# Patient Record
Sex: Female | Born: 1945 | Race: Black or African American | Hispanic: No | Smoking: Former smoker
Health system: Southern US, Community
[De-identification: ages and names within clinical notes are randomized; demographics above are authoritative.]

## PROBLEM LIST (undated history)

## (undated) DIAGNOSIS — I10 Essential (primary) hypertension: Secondary | ICD-10-CM

## (undated) DIAGNOSIS — E785 Hyperlipidemia, unspecified: Secondary | ICD-10-CM

## (undated) DIAGNOSIS — E119 Type 2 diabetes mellitus without complications: Secondary | ICD-10-CM

## (undated) HISTORY — DX: Type 2 diabetes mellitus without complications: E11.9

## (undated) HISTORY — DX: Essential (primary) hypertension: I10

## (undated) HISTORY — DX: Hyperlipidemia, unspecified: E78.5

## (undated) HISTORY — PX: OTHER SURGICAL HISTORY: SHX169

---

## 1978-01-05 HISTORY — PX: CHOLECYSTECTOMY: SHX55

## 2009-08-28 ENCOUNTER — Other Ambulatory Visit: Admission: RE | Admit: 2009-08-28 | Discharge: 2009-08-28 | Payer: Self-pay | Admitting: Obstetrics & Gynecology

## 2009-08-28 ENCOUNTER — Ambulatory Visit: Payer: Self-pay | Admitting: Obstetrics & Gynecology

## 2009-09-18 ENCOUNTER — Ambulatory Visit: Payer: Self-pay | Admitting: Obstetrics & Gynecology

## 2009-10-10 ENCOUNTER — Ambulatory Visit: Payer: Self-pay | Admitting: Obstetrics and Gynecology

## 2009-10-10 ENCOUNTER — Other Ambulatory Visit: Admission: RE | Admit: 2009-10-10 | Discharge: 2009-10-10 | Payer: Self-pay | Admitting: Obstetrics & Gynecology

## 2009-10-31 ENCOUNTER — Ambulatory Visit: Payer: Self-pay | Admitting: Obstetrics and Gynecology

## 2012-01-06 LAB — HM COLONOSCOPY

## 2013-01-05 LAB — HM PAP SMEAR: HM Pap smear: NORMAL

## 2013-01-05 LAB — HM MAMMOGRAPHY: HM Mammogram: NORMAL

## 2014-07-18 ENCOUNTER — Telehealth: Payer: Self-pay | Admitting: *Deleted

## 2014-07-18 ENCOUNTER — Encounter: Payer: Self-pay | Admitting: *Deleted

## 2014-07-18 NOTE — Telephone Encounter (Signed)
Pre-Visit Call completed with patient and chart updated.   Pre-Visit Info documented in Specialty Comments under SnapShot.    

## 2014-07-19 ENCOUNTER — Ambulatory Visit (INDEPENDENT_AMBULATORY_CARE_PROVIDER_SITE_OTHER): Payer: Commercial Managed Care - HMO | Admitting: Family

## 2014-07-19 ENCOUNTER — Encounter: Payer: Self-pay | Admitting: Family

## 2014-07-19 VITALS — BP 120/80 | HR 60 | Temp 98.1°F | Resp 16 | Ht 69.0 in | Wt 197.4 lb

## 2014-07-19 DIAGNOSIS — R5382 Chronic fatigue, unspecified: Secondary | ICD-10-CM | POA: Diagnosis not present

## 2014-07-19 DIAGNOSIS — J0100 Acute maxillary sinusitis, unspecified: Secondary | ICD-10-CM

## 2014-07-19 LAB — HEPATIC FUNCTION PANEL
ALK PHOS: 44 U/L (ref 39–117)
ALT: 16 U/L (ref 0–35)
AST: 20 U/L (ref 0–37)
Albumin: 3.8 g/dL (ref 3.5–5.2)
Bilirubin, Direct: 0.1 mg/dL (ref 0.0–0.3)
TOTAL PROTEIN: 7.3 g/dL (ref 6.0–8.3)
Total Bilirubin: 0.5 mg/dL (ref 0.2–1.2)

## 2014-07-19 LAB — URINALYSIS, ROUTINE W REFLEX MICROSCOPIC
Bilirubin Urine: NEGATIVE
Ketones, ur: NEGATIVE
Leukocytes, UA: NEGATIVE
Nitrite: NEGATIVE
Specific Gravity, Urine: 1.025 (ref 1.000–1.030)
Total Protein, Urine: NEGATIVE
Urine Glucose: NEGATIVE
Urobilinogen, UA: 0.2 (ref 0.0–1.0)
pH: 5.5 (ref 5.0–8.0)

## 2014-07-19 LAB — CBC WITH DIFFERENTIAL/PLATELET
Basophils Absolute: 0 10*3/uL (ref 0.0–0.1)
Basophils Relative: 0.6 % (ref 0.0–3.0)
Eosinophils Absolute: 0.1 10*3/uL (ref 0.0–0.7)
Eosinophils Relative: 1.7 % (ref 0.0–5.0)
HCT: 42.9 % (ref 36.0–46.0)
Hemoglobin: 14.2 g/dL (ref 12.0–15.0)
LYMPHS PCT: 36.2 % (ref 12.0–46.0)
Lymphs Abs: 2.9 10*3/uL (ref 0.7–4.0)
MCHC: 33.1 g/dL (ref 30.0–36.0)
MCV: 95.8 fl (ref 78.0–100.0)
Monocytes Absolute: 0.4 10*3/uL (ref 0.1–1.0)
Monocytes Relative: 5.3 % (ref 3.0–12.0)
NEUTROS ABS: 4.5 10*3/uL (ref 1.4–7.7)
NEUTROS PCT: 56.2 % (ref 43.0–77.0)
Platelets: 180 10*3/uL (ref 150.0–400.0)
RBC: 4.48 Mil/uL (ref 3.87–5.11)
RDW: 13.7 % (ref 11.5–15.5)
WBC: 8.2 10*3/uL (ref 4.0–10.5)

## 2014-07-19 LAB — BASIC METABOLIC PANEL
BUN: 14 mg/dL (ref 6–23)
CALCIUM: 9.2 mg/dL (ref 8.4–10.5)
CO2: 27 meq/L (ref 19–32)
Chloride: 105 mEq/L (ref 96–112)
Creatinine, Ser: 0.81 mg/dL (ref 0.40–1.20)
GFR: 90.52 mL/min (ref >60.00)
GLUCOSE: 145 mg/dL — AB (ref 70–99)
Potassium: 3.7 mEq/L (ref 3.5–5.1)
Sodium: 139 mEq/L (ref 135–145)

## 2014-07-19 LAB — TSH: TSH: 2.06 u[IU]/mL (ref 0.35–4.50)

## 2014-07-19 MED ORDER — AMOXICILLIN-POT CLAVULANATE 875-125 MG PO TABS: 1.0000 | ORAL_TABLET | Freq: Two times a day (BID) | ORAL | Status: DC

## 2014-07-19 NOTE — Progress Notes (Signed)
Pre visit review using our clinic review tool, if applicable. No additional management support is needed unless otherwise documented below in the visit note. 

## 2014-07-19 NOTE — Patient Instructions (Addendum)
Please complete lab work prior to leaving. Start augmentin for sinus infection. Follow up in 2 weeks.  Call sooner if symptoms worsen or if symptoms do not improve.

## 2014-07-19 NOTE — Progress Notes (Signed)
Subjective:    Patient ID: [name], female    DOB: 10/05/1945, 69 y.o.   MRN: [id number]  HPI  Ms. Nicole Waters is a 69 yr old female who presents today to establish care.  She is a poor historian.  She reports chief complaint of fatigue. Pt reports that she began feeling run down about 1 month ago.  She reports associated sputum (clear) and right maxillary sinus pain/pressure and soreness in the right nare.  She reports some nasal drainage. Denies associated fever, sore throat, or ear pain.   Reports no alleviating factors.    Review of Systems  Constitutional: Negative for unexpected weight change.  HENT: Positive for rhinorrhea.        Does not hear as well out of left ear  Eyes: Negative for visual disturbance.  Respiratory: Negative for cough and shortness of breath.   Cardiovascular: Negative for leg swelling.  Gastrointestinal: Negative for nausea, diarrhea and constipation.  Genitourinary: Negative for dysuria and frequency.  Musculoskeletal: Negative for myalgias and arthralgias.  Skin: Negative for rash.  Neurological: Negative for headaches.  Hematological: Negative for adenopathy.  Psychiatric/Behavioral: Negative for agitation.       Reports that she sometimes feels down from missing her family members who are deceased.    Past Medical History  Diagnosis Date  . Hyperlipidemia   . Hypertension   . Diabetes mellitus without complication     History   Social History  . Marital Status: Widowed    Spouse Name: N/A  . Number of Children: N/A  . Years of Education: N/A   Occupational History  . Not on file.   Social History Main Topics  . Smoking status: Never Smoker   . Smokeless tobacco: Not on file  . Alcohol Use: No  . Drug Use: No  . Sexual Activity: Not on file   Other Topics Concern  . Not on file   Social History Narrative    Past Surgical History  Procedure Laterality Date  . Cholecystectomy  1980  . Leep surgery      Family  History  Problem Relation Age of Onset  . Diabetes Mother   . Cancer Father     lung  . Heart disease Father   . Cancer Paternal Uncle     lung  . Diabetes Maternal Grandmother   . Heart disease Maternal Grandfather   . Cancer Paternal Grandmother     ovarian  . Heart disease Paternal Grandfather   . Diabetes Sister   . Diabetes Sister     Allergies  Allergen Reactions  . Sulfa Antibiotics Rash    Current Outpatient Prescriptions on File Prior to Visit  Medication Sig Dispense Refill  . aspirin 81 MG tablet Take 81 mg by mouth daily.    Marland Kitchen. glipiZIDE (GLUCOTROL) 10 MG tablet Take 10 mg by mouth 2 (two) times daily before a meal.    . lisinopril (PRINIVIL,ZESTRIL) 20 MG tablet Take 20 mg by mouth daily.    . metoprolol succinate (TOPROL-XL) 25 MG 24 hr tablet Take 25 mg by mouth 2 (two) times daily.    Marland Kitchen. oxybutynin (DITROPAN) 5 MG tablet Take 5 mg by mouth 3 (three) times daily.    . simvastatin (ZOCOR) 20 MG tablet Take 20 mg by mouth every evening.    . sitaGLIPtin (JANUVIA) 100 MG tablet Take 100 mg by mouth daily.     No current facility-administered medications on file prior to visit.    BP  120/80 mmHg  Pulse 60  Temp(Src) 98.1 F (36.7 C) (Oral)  Resp 16  Ht  (1.753 m)  Wt 197 lb 6.4 oz (89.54 kg)  BMI 29.14 kg/m2  SpO2 96%       Objective:   Physical Exam  Constitutional: She is oriented to person, place, and time. She appears well-developed and well-nourished.  HENT:  Right Ear: Tympanic membrane and ear canal normal.  Left Ear: Tympanic membrane and ear canal normal.  Mouth/Throat: No oropharyngeal exudate, posterior oropharyngeal edema or posterior oropharyngeal erythema.  Nasal bridge appears slightly crooked, Deviated septum noted.   Cardiovascular: Normal rate, regular rhythm and normal heart sounds.   No murmur heard. Pulmonary/Chest: Effort normal and breath sounds normal. No respiratory distress. She has no wheezes.  Musculoskeletal: She  exhibits no edema.  Lymphadenopathy:    She has no cervical adenopathy.  Neurological: She is alert and oriented to person, place, and time.  Skin: Skin is warm and dry.  Psychiatric: She has a normal mood and affect. Her behavior is normal. Judgment and thought content normal.          Assessment & Plan:

## 2014-07-21 LAB — URINE CULTURE: Colony Count: 50000

## 2014-07-22 ENCOUNTER — Encounter: Payer: Self-pay | Admitting: Family

## 2014-07-22 DIAGNOSIS — J320 Chronic maxillary sinusitis: Secondary | ICD-10-CM | POA: Insufficient documentation

## 2014-07-22 NOTE — Assessment & Plan Note (Signed)
Trial of augmentin. Will also obtain labs as listed below to rule out other factors which may be contributing to her fatigue.

## 2014-08-03 ENCOUNTER — Encounter: Payer: Self-pay | Admitting: Family

## 2014-08-03 ENCOUNTER — Ambulatory Visit (INDEPENDENT_AMBULATORY_CARE_PROVIDER_SITE_OTHER): Payer: Commercial Managed Care - HMO | Admitting: Family

## 2014-08-03 VITALS — BP 122/82 | HR 55 | Temp 97.9°F | Resp 16 | Ht 69.0 in | Wt 196.2 lb

## 2014-08-03 DIAGNOSIS — E785 Hyperlipidemia, unspecified: Secondary | ICD-10-CM | POA: Diagnosis not present

## 2014-08-03 DIAGNOSIS — J309 Allergic rhinitis, unspecified: Secondary | ICD-10-CM | POA: Diagnosis not present

## 2014-08-03 DIAGNOSIS — B370 Candidal stomatitis: Secondary | ICD-10-CM | POA: Diagnosis not present

## 2014-08-03 DIAGNOSIS — E119 Type 2 diabetes mellitus without complications: Secondary | ICD-10-CM

## 2014-08-03 LAB — LIPID PANEL
CHOLESTEROL: 109 mg/dL (ref 0–200)
HDL: 34 mg/dL — ABNORMAL LOW (ref >39.00)
LDL CALC: 57 mg/dL (ref 0–99)
NonHDL: 75.37
TRIGLYCERIDES: 92 mg/dL (ref 0.0–149.0)
Total CHOL/HDL Ratio: 3
VLDL: 18.4 mg/dL (ref 0.0–40.0)

## 2014-08-03 LAB — HEMOGLOBIN A1C: Hgb A1c MFr Bld: 8.4 % — ABNORMAL HIGH (ref 4.6–6.5)

## 2014-08-03 LAB — MICROALBUMIN / CREATININE URINE RATIO
Creatinine,U: 176.2 mg/dL
Microalb Creat Ratio: 0.4 mg/g (ref 0.0–30.0)
Microalb, Ur: <0.7 mg/dL (ref 0.0–1.9)

## 2014-08-03 MED ORDER — NYSTATIN 100000 UNIT/ML MT SUSP: 5.0000 mL | Freq: Four times a day (QID) | OROMUCOSAL | Status: DC

## 2014-08-03 MED ORDER — SITAGLIPTIN PHOSPHATE 100 MG PO TABS: 100.0000 mg | ORAL_TABLET | Freq: Every day | ORAL | Status: DC

## 2014-08-03 MED ORDER — FLUTICASONE PROPIONATE 50 MCG/ACT NA SUSP: 2.0000 | Freq: Every day | NASAL | Status: DC

## 2014-08-03 NOTE — Assessment & Plan Note (Signed)
Trial of claritin flonase.

## 2014-08-03 NOTE — Progress Notes (Signed)
Subjective:    Patient ID: Nicole Waters, female    DOB: 07/07/1945, 69 y.o.   MRN: [id number]  HPI  Nicole Waters is a 69 yr old female who presents today for follow up.  Fatigue- she was last seen 2 weeks ago.  She reported fatigue at that time which was associated with maxillayr sinus pain/pressure. She was given rx for augmentin for sinusits. Glucose was elevated mildly (145) but blood count, thyroid function,  Liver function normal.  She reports that she continues to have some nasal congestion on the right.      Review of Systems See HPI  Past Medical History  Diagnosis Date  . Hyperlipidemia   . Hypertension   . Diabetes mellitus without complication     History   Social History  . Marital Status: Widowed    Spouse Name: N/A  . Number of Children: N/A  . Years of Education: N/A   Occupational History  . Not on file.   Social History Main Topics  . Smoking status: Never Smoker   . Smokeless tobacco: Not on file  . Alcohol Use: No  . Drug Use: No  . Sexual Activity: Not on file   Other Topics Concern  . Not on file   Social History Narrative   Works part time in an in home daycare   Son- lives locally, he has 3 children   Daughter-lives locally- has 2 children   Lives alone   Completed [employer] for childcare       Past Surgical History  Procedure Laterality Date  . Cholecystectomy  1980  . Leep surgery      Family History  Problem Relation Age of Onset  . Diabetes Mother   . Cancer Father     lung  . Heart disease Father   . Cancer Paternal Uncle     lung  . Diabetes Maternal Grandmother   . Heart disease Maternal Grandfather   . Cancer Paternal Grandmother     ovarian  . Heart disease Paternal Grandfather   . Diabetes Sister     died from DM complications  . Diabetes Sister     had ESRD, kidney failure, died from liver disease    Allergies  Allergen Reactions  . Sulfa Antibiotics Rash    Current Outpatient Prescriptions on  File Prior to Visit  Medication Sig Dispense Refill  . aspirin 81 MG tablet Take 81 mg by mouth daily.    Marland Kitchen glipiZIDE (GLUCOTROL) 10 MG tablet Take 10 mg by mouth 2 (two) times daily before a meal.    . lisinopril (PRINIVIL,ZESTRIL) 20 MG tablet Take 20 mg by mouth daily.    . metoprolol succinate (TOPROL-XL) 25 MG 24 hr tablet Take 25 mg by mouth 2 (two) times daily.    Marland Kitchen oxybutynin (DITROPAN) 5 MG tablet Take 5 mg by mouth 2 (two) times daily.     . simvastatin (ZOCOR) 20 MG tablet Take 20 mg by mouth every evening.    . sitaGLIPtin (JANUVIA) 100 MG tablet Take 100 mg by mouth daily.    Marland Kitchen amoxicillin-clavulanate (AUGMENTIN) 875-125 MG per tablet Take 1 tablet by mouth 2 (two) times daily. (Patient not taking: Reported on 08/03/2014) 20 tablet 0   No current facility-administered medications on file prior to visit.    BP 122/82 mmHg  Pulse 55  Temp(Src) 97.9 F (36.6 C) (Oral)  Resp 16  Ht  (1.753 m)  Wt 196 lb 3.2 oz (88.996 kg)  BMI 28.96 kg/m2  SpO2 95%       Objective:   Physical Exam  Constitutional: She appears well-developed and well-nourished.  HENT:  Right Ear: Tympanic membrane and ear canal normal.  Left Ear: Tympanic membrane and ear canal normal.  Mouth/Throat: No posterior oropharyngeal edema or posterior oropharyngeal erythema.  Tongue is brownish in color (posterior half)  Cardiovascular: Normal rate, regular rhythm and normal heart sounds.   No murmur heard. Pulmonary/Chest: Effort normal and breath sounds normal. No respiratory distress. She has no wheezes.  Psychiatric: She has a normal mood and affect. Her behavior is normal. Judgment and thought content normal.          Assessment & Plan:

## 2014-08-03 NOTE — Progress Notes (Signed)
Pre visit review using our clinic review tool, if applicable. No additional management support is needed unless otherwise documented below in the visit note. 

## 2014-08-03 NOTE — Patient Instructions (Signed)
Please complete lab work prior to leaving. Schedule medicare wellness visit at the front desk.  

## 2014-08-03 NOTE — Assessment & Plan Note (Signed)
?   Oral thrush from recent abx. Will rx with nystatin.

## 2014-08-05 ENCOUNTER — Telehealth: Payer: Self-pay | Admitting: Family

## 2014-08-05 NOTE — Telephone Encounter (Signed)
Please let pt know that sugar is above goal. Has she been able to take metformin in the past?  If so, advise pt to start metforming  bid in addition to current meds.  If she has been intolerant to metformin in the past, advise her to start invokana  once daily #30 with 2 refills.

## 2014-08-06 ENCOUNTER — Telehealth: Payer: Self-pay | Admitting: *Deleted

## 2014-08-06 MED ORDER — PIOGLITAZONE HCL 15 MG PO TABS: 15.0000 mg | ORAL_TABLET | Freq: Every day | ORAL | Status: DC

## 2014-08-06 NOTE — Telephone Encounter (Signed)
Notified pt. She was unable to tolerate metformin due to diarrhea and states she was prescribed Invokana in the past but never started it as it was too expensive.  Please advise if there may be a cheaper alternative?

## 2014-08-06 NOTE — Telephone Encounter (Signed)
Pt states she has pt assistance form to help get cheaper cost for her Januvia. She will drop form at our office once she completes her portion. Awaiting form.

## 2014-08-06 NOTE — Telephone Encounter (Signed)
Notified pt and she voices understanding. 

## 2014-08-06 NOTE — Telephone Encounter (Signed)
rx sent for actos.  This is generic.

## 2014-08-09 NOTE — Telephone Encounter (Signed)
Received and forwarded to Melissa. JG//CMA 

## 2014-08-14 NOTE — Telephone Encounter (Signed)
Completed forms mailed to [employer] pt assistance in provided envelope. Copy placed up front for pt to pick up, pt aware. Copy sent for scanning. JG//CMA

## 2014-10-16 ENCOUNTER — Encounter: Payer: Self-pay | Admitting: Family

## 2014-10-16 ENCOUNTER — Ambulatory Visit (INDEPENDENT_AMBULATORY_CARE_PROVIDER_SITE_OTHER): Payer: Commercial Managed Care - HMO | Admitting: Family

## 2014-10-16 VITALS — BP 119/71 | HR 59 | Temp 98.0°F | Ht 69.0 in | Wt 193.6 lb

## 2014-10-16 DIAGNOSIS — Z23 Encounter for immunization: Secondary | ICD-10-CM | POA: Diagnosis not present

## 2014-10-16 DIAGNOSIS — E2839 Other primary ovarian failure: Secondary | ICD-10-CM | POA: Diagnosis not present

## 2014-10-16 DIAGNOSIS — Z1231 Encounter for screening mammogram for malignant neoplasm of breast: Secondary | ICD-10-CM | POA: Diagnosis not present

## 2014-10-16 DIAGNOSIS — Z0000 Encounter for general adult medical examination without abnormal findings: Secondary | ICD-10-CM | POA: Diagnosis not present

## 2014-10-16 DIAGNOSIS — I10 Essential (primary) hypertension: Secondary | ICD-10-CM | POA: Insufficient documentation

## 2014-10-16 DIAGNOSIS — IMO0001 Reserved for inherently not codable concepts without codable children: Secondary | ICD-10-CM

## 2014-10-16 DIAGNOSIS — E1165 Type 2 diabetes mellitus with hyperglycemia: Secondary | ICD-10-CM | POA: Diagnosis not present

## 2014-10-16 DIAGNOSIS — N3281 Overactive bladder: Secondary | ICD-10-CM

## 2014-10-16 DIAGNOSIS — E785 Hyperlipidemia, unspecified: Secondary | ICD-10-CM | POA: Insufficient documentation

## 2014-10-16 DIAGNOSIS — IMO0002 Reserved for concepts with insufficient information to code with codable children: Secondary | ICD-10-CM | POA: Insufficient documentation

## 2014-10-16 DIAGNOSIS — Z1239 Encounter for other screening for malignant neoplasm of breast: Secondary | ICD-10-CM

## 2014-10-16 DIAGNOSIS — Z794 Long term (current) use of insulin: Secondary | ICD-10-CM | POA: Diagnosis not present

## 2014-10-16 DIAGNOSIS — J309 Allergic rhinitis, unspecified: Secondary | ICD-10-CM

## 2014-10-16 LAB — URINALYSIS, ROUTINE W REFLEX MICROSCOPIC
Bilirubin Urine: NEGATIVE
KETONES UR: NEGATIVE
Leukocytes, UA: NEGATIVE
Nitrite: NEGATIVE
PH: 5.5 (ref 5.0–8.0)
SPECIFIC GRAVITY, URINE: 1.025 (ref 1.000–1.030)
Total Protein, Urine: NEGATIVE
URINE GLUCOSE: NEGATIVE
UROBILINOGEN UA: 0.2 (ref 0.0–1.0)

## 2014-10-16 LAB — BASIC METABOLIC PANEL
BUN: 15 mg/dL (ref 6–23)
CALCIUM: 9.5 mg/dL (ref 8.4–10.5)
CO2: 25 mEq/L (ref 19–32)
CREATININE: 0.82 mg/dL (ref 0.40–1.20)
Chloride: 104 mEq/L (ref 96–112)
GFR: 89.18 mL/min (ref >60.00)
Glucose, Bld: 129 mg/dL — ABNORMAL HIGH (ref 70–99)
Potassium: 4.1 mEq/L (ref 3.5–5.1)
Sodium: 140 mEq/L (ref 135–145)

## 2014-10-16 MED ORDER — OXYBUTYNIN CHLORIDE 5 MG PO TABS
5.0000 mg | ORAL_TABLET | Freq: Three times a day (TID) | ORAL | Status: DC
Start: 2014-10-16 — End: 2015-01-16

## 2014-10-16 NOTE — Assessment & Plan Note (Signed)
Stable on lisinopril, continue same.  

## 2014-10-16 NOTE — Assessment & Plan Note (Signed)
Uncontrolled. Increase ditropan from bid to tid.  Improved glycemic control should also help.

## 2014-10-16 NOTE — Progress Notes (Signed)
Pre visit review using our clinic review tool, if applicable. No additional management support is needed unless otherwise documented below in the visit note. 

## 2014-10-16 NOTE — Progress Notes (Signed)
Subjective:    Nicole Waters is a 69 y.o. female who presents for Medicare Annual/Subsequent preventive examination.  Preventive Screening-Counseling & Management  Tobacco History  Smoking status  . Never Smoker   Smokeless tobacco  . Not on file     Problems Prior to Visit 1. DM2- maintained on glucotrol, actos, januvia, Novolin N 15 units BID  Last visit she was started on actos. Pt reports that her sugars fasting 130 fasting and lower later on. Reports that could not afford Novolin + Januvia.   Lab Results  Component Value Date   HGBA1C 8.4* 08/03/2014   Lab Results  Component Value Date   MICROALBUR <0.7 08/03/2014   LDLCALC 57 08/03/2014   CREATININE 0.81 07/19/2014   2.  Hyperlipidemia- maintained on simvastatin Lab Results  Component Value Date   CHOL 109 08/03/2014   HDL 34.00* 08/03/2014   LDLCALC 57 08/03/2014   TRIG 92.0 08/03/2014   CHOLHDL 3 08/03/2014   3.  Allergic rhinitis- maintained on flonase- prn.   4.  HTN- on lisinopril.   BP Readings from Last 3 Encounters:  10/16/14 119/71  08/03/14 122/82  07/19/14 120/80   5.  Overactive bladder-  On ditropan. But reports that it is not helping.    6. Preventative-Patient presents today for complete physical.  Immunizations: due for tetanus, flu, prevnar  Diet: diet could be better Exercise: not exercising regularly Colonoscopy: 2  yrs ago- Dr. Sharyn Creamer- 5 yr follow up recommended Dexa: thinks >3 yrs ago Pap Smear: 2015 Mammogram: 2015- [facility]      Current Problems (verified) Patient Active Problem List   Diagnosis Date Noted  . Allergic rhinitis 08/03/2014  . Thrush 08/03/2014    Medications Prior to Visit Current Outpatient Prescriptions on File Prior to Visit  Medication Sig Dispense Refill  . ACCU-CHEK SMARTVIEW test strip Use to check blood sugar daily.    Marland Kitchen amoxicillin-clavulanate (AUGMENTIN) 875-125 MG per tablet Take 1 tablet by mouth 2 (two) times daily. 20 tablet 0  .  aspirin 81 MG tablet Take 81 mg by mouth daily.    . fluticasone (FLONASE) 50 MCG/ACT nasal spray Place 2 sprays into both nostrils daily. 16 g 2  . glipiZIDE (GLUCOTROL) 10 MG tablet Take 10 mg by mouth 2 (two) times daily before a meal.    . lisinopril (PRINIVIL,ZESTRIL) 20 MG tablet Take 20 mg by mouth daily.    . metoprolol succinate (TOPROL-XL) 25 MG 24 hr tablet Take 25 mg by mouth 2 (two) times daily.    Marland Kitchen nystatin (MYCOSTATIN) 100000 UNIT/ML suspension Take 5 mLs (500,000 Units total) by mouth 4 (four) times daily. 180 mL 0  . oxybutynin (DITROPAN) 5 MG tablet Take 5 mg by mouth 2 (two) times daily.     . pioglitazone (ACTOS) 15 MG tablet Take 1 tablet (15 mg total) by mouth daily. 30 tablet 3  . simvastatin (ZOCOR) 20 MG tablet Take 20 mg by mouth every evening.    . sitaGLIPtin (JANUVIA) 100 MG tablet Take 1 tablet (100 mg total) by mouth daily. 30 tablet 5  . NOVOLIN N 100 UNIT/ML injection Inject 15 Units into the skin 2 (two) times daily.     No current facility-administered medications on file prior to visit.    Current Medications (verified) Current Outpatient Prescriptions  Medication Sig Dispense Refill  . ACCU-CHEK SMARTVIEW test strip Use to check blood sugar daily.    Marland Kitchen amoxicillin-clavulanate (AUGMENTIN) 875-125 MG per tablet Take 1 tablet  by mouth 2 (two) times daily. 20 tablet 0  . aspirin 81 MG tablet Take 81 mg by mouth daily.    . fluticasone (FLONASE) 50 MCG/ACT nasal spray Place 2 sprays into both nostrils daily. 16 g 2  . glipiZIDE (GLUCOTROL) 10 MG tablet Take 10 mg by mouth 2 (two) times daily before a meal.    . lisinopril (PRINIVIL,ZESTRIL) 20 MG tablet Take 20 mg by mouth daily.    . metoprolol succinate (TOPROL-XL) 25 MG 24 hr tablet Take 25 mg by mouth 2 (two) times daily.    Marland Kitchen nystatin (MYCOSTATIN) 100000 UNIT/ML suspension Take 5 mLs (500,000 Units total) by mouth 4 (four) times daily. 180 mL 0  . oxybutynin (DITROPAN) 5 MG tablet Take 5 mg by mouth 2  (two) times daily.     . pioglitazone (ACTOS) 15 MG tablet Take 1 tablet (15 mg total) by mouth daily. 30 tablet 3  . simvastatin (ZOCOR) 20 MG tablet Take 20 mg by mouth every evening.    . sitaGLIPtin (JANUVIA) 100 MG tablet Take 1 tablet (100 mg total) by mouth daily. 30 tablet 5  . ACCU-CHEK FASTCLIX LANCETS MISC     . NOVOLIN N 100 UNIT/ML injection Inject 15 Units into the skin 2 (two) times daily.     No current facility-administered medications for this visit.     Allergies (verified) Sulfa antibiotics and Metformin and related   PAST HISTORY  Family History Family History  Problem Relation Age of Onset  . Diabetes Mother   . Cancer Father     lung  . Heart disease Father   . Cancer Paternal Uncle     lung  . Diabetes Maternal Grandmother   . Heart disease Maternal Grandfather   . Cancer Paternal Grandmother     ovarian  . Heart disease Paternal Grandfather   . Diabetes Sister     died from DM complications  . Diabetes Sister     had ESRD, kidney failure, died from liver disease    Social History Social History  Substance Use Topics  . Smoking status: Never Smoker   . Smokeless tobacco: Not on file  . Alcohol Use: No     Are there smokers in your home (other than you)? No  Risk Factors Current exercise habits: active at job only  Dietary issues discussed: diabetic diet   Cardiac risk factors: advanced age (older than 61 for men, 54 for women).Hyperlipidemia, HTN, DM2  Depression Screen (Note: if answer to either of the following is "Yes", a more complete depression screening is indicated)   Over the past two weeks, have you felt down, depressed or hopeless? No  Over the past two weeks, have you felt little interest or pleasure in doing things? No  Have you lost interest or pleasure in daily life? No   Activities of Daily Living In your present state of health, do you have any difficulty performing the following activities?:  Driving? No Managing  money?  No Feeding yourself? No Getting from bed to chair? No . Climbing a flight of stairs? No Preparing food and eating?: No Bathing or showering? No Getting dressed: No Getting to the toilet? No Using the toilet:No Moving around from place to place: No In the past year have you fallen or had a near fall?:No   Are you sexually active?  Yes  Do you have more than one partner?  No  Hearing Difficulties: yes Do you often ask people to speak up  or repeat themselves? Yes Do you experience ringing or noises in your ears? sometimes Do you have difficulty understanding soft or whispered voices? Yes   Do you feel that you have a problem with memory? Yes- remembering names  Do you often misplace items? No  Do you feel safe at home?  Yes  Cognitive Testing  Alert? Yes  Normal Appearance?Yes  Oriented to person? Yes  Place? Yes   Time? Yes  Recall of three objects?  Yes  Can perform simple calculations? Yes  Displays appropriate judgment?Yes  Can read the correct time from a watch face?Yes   Advanced Directives have been discussed with the patient? Yes  List the Names of Other Physician/Practitioners you currently use: 1.  See updated snapshot  Indicate any recent Medical Services you may have received from other than [facility] providers in the past year (date may be approximate).  Immunization History  Administered Date(s) Administered  . Zoster 01/05/2013    Screening Tests Health Maintenance  Topic Date Due  . Hepatitis C Screening  Oct 05, 1945  . FOOT EXAM  11/16/1956  . OPHTHALMOLOGY EXAM  11/16/1956  . TETANUS/TDAP  11/16/1965  . DEXA SCAN  11/17/2011  . PNA vac Low Risk Adult (1 of 2 - PCV13) 11/17/2011  . INFLUENZA VACCINE  08/06/2014  . MAMMOGRAM  01/06/2015  . HEMOGLOBIN A1C  02/03/2015  . URINE MICROALBUMIN  08/03/2015  . COLONOSCOPY  01/05/2022  . ZOSTAVAX  Completed    All answers were reviewed with the patient and necessary referrals were  made:  O'SULLIVAN,Nicole Leduc S., Nicole Waters   10/16/2014   History reviewed: allergies, current medications, past family history, past medical history, past social history, past surgical history and problem list  Review of Systems Review of Systems  Constitutional: Negative for weight loss.  HENT: Negative for congestion.   Eyes: Negative for blurred vision and double vision.  Respiratory: Negative for cough.   Cardiovascular: Negative for chest pain.  Gastrointestinal: Negative for diarrhea.  Genitourinary: Positive for frequency.  Musculoskeletal:       Left knee pain  Skin: Negative for rash.  Neurological:       Denies HA  Endo/Heme/Allergies: Does not bruise/bleed easily.  Psychiatric/Behavioral:       Reports mild depression, declines medication   Objective:    There is no weight on file to calculate BMI. Ht  (1.753 m) Physical Exam  Constitutional: She is oriented to person, place, and time. She appears well-developed and well-nourished. No distress.  HENT:  Head: Normocephalic and atraumatic.  Right Ear: Tympanic membrane and ear canal normal.  Left Ear: Tympanic membrane and ear canal normal.  Mouth/Throat: Oropharynx is clear and moist.  Eyes: Pupils are equal, round, and reactive to light. No scleral icterus.  Neck: Normal range of motion. No thyromegaly present.  Cardiovascular: Normal rate and regular rhythm.   No murmur heard. Pulmonary/Chest: Effort normal and breath sounds normal. No respiratory distress. He has no wheezes. She has no rales. She exhibits no tenderness.  Abdominal: Soft. Bowel sounds are normal. He exhibits no distension and no mass. There is no tenderness. There is no rebound and no guarding.  Musculoskeletal: She exhibits no edema.  Lymphadenopathy:    She has no cervical adenopathy.  Neurological: She is alert and oriented to person, place, and time. She has normal patellar reflexes. She exhibits normal muscle tone. Coordination normal.  Skin:  Skin is warm and dry.  Psychiatric: She has a normal mood and affect. Her  behavior is normal. Judgment and thought content normal.  Breasts: Examined lying Right: Without masses, retractions, discharge or axillary adenopathy.  Left: Without masses, retractions, discharge or axillary adenopathy.          Assessment & Plan:        Assessment:          Plan:     During the course of the visit the patient was educated and counseled about appropriate screening and preventive services including:    Influenza vaccine  Bone densitometry screening  Advanced directives: no HCPOA- information provided  Diet review for nutrition referral? Yes ____  Not Indicated _x___   Patient Instructions (the written plan) was given to the patient.  Medicare Attestation I have personally reviewed: The patient's medical and social history Their use of alcohol, tobacco or illicit drugs Their current medications and supplements The patient's functional ability including ADLs,fall risks, home safety risks, cognitive, and hearing and visual impairment Diet and physical activities Evidence for depression or mood disorders  The patient's weight, height, BMI, and visual acuity have been recorded in the chart.  I have made referrals, counseling, and provided education to the patient based on review of the above and I have provided the patient with a written personalized care plan for preventive services.     O'SULLIVAN,Nicole Nies S., Nicole Waters   10/16/2014

## 2014-10-16 NOTE — Assessment & Plan Note (Signed)
Stable on flonase, continue same.   

## 2014-10-16 NOTE — Assessment & Plan Note (Signed)
Could not afford insulin + januvia. D/c Januvia, increase novolin from 15 units bid to 18 units bid.

## 2014-10-16 NOTE — Assessment & Plan Note (Signed)
LDL at goal, tolerating statin, continue same.  

## 2014-10-16 NOTE — Patient Instructions (Addendum)
Stop Januvia, restart Novolin but increase to 18 units twice daily.  Increase detrol to  3 times daily. Complete lab work prior to leaving. Follow up in 3 months.

## 2014-10-16 NOTE — Assessment & Plan Note (Signed)
Discussed healthy diet, exercise weight loss. Declines flu shot.  Refer for dexa.

## 2014-10-17 NOTE — Addendum Note (Signed)
Addended by: Mervin KungFERGERSON, Chau Savell A on: 10/17/2014 08:17 AM   Modules accepted: Orders

## 2014-10-18 ENCOUNTER — Encounter: Payer: Self-pay | Admitting: Family

## 2014-10-23 ENCOUNTER — Ambulatory Visit (HOSPITAL_BASED_OUTPATIENT_CLINIC_OR_DEPARTMENT_OTHER): Payer: Commercial Managed Care - HMO

## 2014-10-23 ENCOUNTER — Other Ambulatory Visit (HOSPITAL_BASED_OUTPATIENT_CLINIC_OR_DEPARTMENT_OTHER): Payer: Commercial Managed Care - HMO

## 2014-11-09 ENCOUNTER — Ambulatory Visit (HOSPITAL_BASED_OUTPATIENT_CLINIC_OR_DEPARTMENT_OTHER)
Admission: RE | Admit: 2014-11-09 | Discharge: 2014-11-09 | Disposition: A | Discharge status: Home / Self Care | Payer: Commercial Managed Care - HMO | Source: Ambulatory Visit | Attending: Family | Admitting: Family

## 2014-11-09 DIAGNOSIS — E2839 Other primary ovarian failure: Secondary | ICD-10-CM

## 2014-11-09 DIAGNOSIS — Z1231 Encounter for screening mammogram for malignant neoplasm of breast: Secondary | ICD-10-CM | POA: Diagnosis not present

## 2014-11-09 DIAGNOSIS — Z87891 Personal history of nicotine dependence: Secondary | ICD-10-CM | POA: Diagnosis not present

## 2014-11-09 DIAGNOSIS — Z1239 Encounter for other screening for malignant neoplasm of breast: Secondary | ICD-10-CM

## 2014-11-09 DIAGNOSIS — Z780 Asymptomatic menopausal state: Secondary | ICD-10-CM | POA: Insufficient documentation

## 2014-11-09 DIAGNOSIS — M810 Age-related osteoporosis without current pathological fracture: Secondary | ICD-10-CM | POA: Diagnosis not present

## 2014-11-09 DIAGNOSIS — R928 Other abnormal and inconclusive findings on diagnostic imaging of breast: Secondary | ICD-10-CM | POA: Insufficient documentation

## 2014-11-11 ENCOUNTER — Encounter: Payer: Self-pay | Admitting: Family

## 2014-11-14 ENCOUNTER — Other Ambulatory Visit: Payer: Self-pay | Admitting: Family

## 2014-11-14 DIAGNOSIS — R928 Other abnormal and inconclusive findings on diagnostic imaging of breast: Secondary | ICD-10-CM

## 2014-11-21 ENCOUNTER — Ambulatory Visit
Admission: RE | Admit: 2014-11-21 | Discharge: 2014-11-21 | Disposition: A | Discharge status: Home / Self Care | Payer: Commercial Managed Care - HMO | Source: Ambulatory Visit | Attending: Family | Admitting: Family

## 2014-11-21 DIAGNOSIS — R928 Other abnormal and inconclusive findings on diagnostic imaging of breast: Secondary | ICD-10-CM

## 2014-11-21 DIAGNOSIS — N6489 Other specified disorders of breast: Secondary | ICD-10-CM | POA: Diagnosis not present

## 2014-11-22 ENCOUNTER — Other Ambulatory Visit: Payer: Self-pay

## 2014-12-08 ENCOUNTER — Other Ambulatory Visit: Payer: Self-pay | Admitting: Family

## 2015-01-02 ENCOUNTER — Telehealth: Payer: Self-pay | Admitting: Family

## 2015-01-02 NOTE — Telephone Encounter (Signed)
Pt never picked up paperwork [employer] Patient Assistance Program Enrollment Form) from 08-14-14 (Mailed it to pt)

## 2015-01-16 ENCOUNTER — Telehealth: Payer: Self-pay | Admitting: Family

## 2015-01-16 ENCOUNTER — Telehealth: Payer: Self-pay | Admitting: *Deleted

## 2015-01-16 ENCOUNTER — Ambulatory Visit (INDEPENDENT_AMBULATORY_CARE_PROVIDER_SITE_OTHER): Payer: Commercial Managed Care - HMO | Admitting: Family

## 2015-01-16 ENCOUNTER — Encounter: Payer: Self-pay | Admitting: Family

## 2015-01-16 VITALS — BP 118/71 | HR 66 | Temp 98.0°F | Resp 16 | Ht 69.0 in | Wt 199.0 lb

## 2015-01-16 DIAGNOSIS — E785 Hyperlipidemia, unspecified: Secondary | ICD-10-CM | POA: Diagnosis not present

## 2015-01-16 DIAGNOSIS — Z794 Long term (current) use of insulin: Secondary | ICD-10-CM

## 2015-01-16 DIAGNOSIS — E1165 Type 2 diabetes mellitus with hyperglycemia: Secondary | ICD-10-CM

## 2015-01-16 DIAGNOSIS — M25512 Pain in left shoulder: Secondary | ICD-10-CM

## 2015-01-16 DIAGNOSIS — L680 Hirsutism: Secondary | ICD-10-CM | POA: Diagnosis not present

## 2015-01-16 DIAGNOSIS — I10 Essential (primary) hypertension: Secondary | ICD-10-CM | POA: Diagnosis not present

## 2015-01-16 DIAGNOSIS — N3281 Overactive bladder: Secondary | ICD-10-CM

## 2015-01-16 DIAGNOSIS — IMO0001 Reserved for inherently not codable concepts without codable children: Secondary | ICD-10-CM

## 2015-01-16 LAB — BASIC METABOLIC PANEL
BUN: 15 mg/dL (ref 6–23)
CALCIUM: 9.4 mg/dL (ref 8.4–10.5)
CO2: 33 mEq/L — ABNORMAL HIGH (ref 19–32)
CREATININE: 0.77 mg/dL (ref 0.40–1.20)
Chloride: 104 mEq/L (ref 96–112)
GFR: 95.83 mL/min (ref >60.00)
Glucose, Bld: 36 mg/dL — CL (ref 70–99)
Potassium: 4 mEq/L (ref 3.5–5.1)
Sodium: 141 mEq/L (ref 135–145)

## 2015-01-16 LAB — HEMOGLOBIN A1C: Hgb A1c MFr Bld: 6.5 % (ref 4.6–6.5)

## 2015-01-16 MED ORDER — FLUTICASONE PROPIONATE 50 MCG/ACT NA SUSP: 2.0000 | Freq: Every day | NASAL | Status: DC

## 2015-01-16 MED ORDER — INSULIN NPH (HUMAN) (ISOPHANE) 100 UNIT/ML ~~LOC~~ SUSP
18.0000 [IU] | Freq: Two times a day (BID) | SUBCUTANEOUS | Status: DC
Start: 2015-01-16 — End: 2015-05-14

## 2015-01-16 MED ORDER — GLIPIZIDE 10 MG PO TABS: 10.0000 mg | ORAL_TABLET | Freq: Two times a day (BID) | ORAL | Status: DC

## 2015-01-16 MED ORDER — ACCU-CHEK SMARTVIEW VI STRP: ORAL_STRIP | Status: DC

## 2015-01-16 MED ORDER — PIOGLITAZONE HCL 15 MG PO TABS: 15.0000 mg | ORAL_TABLET | Freq: Every day | ORAL | Status: DC

## 2015-01-16 MED ORDER — METOPROLOL SUCCINATE ER 25 MG PO TB24
25.0000 mg | ORAL_TABLET | Freq: Two times a day (BID) | ORAL | Status: DC
Start: 2015-01-16 — End: 2015-06-04

## 2015-01-16 MED ORDER — MIRABEGRON ER 25 MG PO TB24: 25.0000 mg | ORAL_TABLET | Freq: Every day | ORAL | Status: DC

## 2015-01-16 MED ORDER — LISINOPRIL 20 MG PO TABS
20.0000 mg | ORAL_TABLET | Freq: Every day | ORAL | Status: DC
Start: 2015-01-16 — End: 2015-10-15

## 2015-01-16 MED ORDER — SIMVASTATIN 20 MG PO TABS: 20.0000 mg | ORAL_TABLET | Freq: Every evening | ORAL | Status: DC

## 2015-01-16 MED ORDER — NOVOLIN N 100 UNIT/ML ~~LOC~~ SUSP: 18.0000 [IU] | Freq: Two times a day (BID) | SUBCUTANEOUS | Status: DC

## 2015-01-16 NOTE — Telephone Encounter (Signed)
Received call from [facility] that pt has been getting Relion brand (generic) Novolin and we sent in Brand name Novolin N. Pharmacy is wanting us to send new Rx with Relion novolin if ok to given generic? I have pended new Rx.  Please advise.

## 2015-01-16 NOTE — Telephone Encounter (Signed)
-----   Message from Sandford CrazeMelissa O'Sullivan, NP sent at 01/16/2015 10:40 AM EST ----- Could you please d/c insulin, myrbetriq and actos from mail order- pt wants these local and I have already sent to [facility]. Thanks!

## 2015-01-16 NOTE — Telephone Encounter (Signed)
Faxed cancellation request to [facility] for glipizide Rx. Left detailed message on pt's home/cell# re: below recommendation and to call and let us know she received our message.

## 2015-01-16 NOTE — Telephone Encounter (Signed)
A1C has improved significantly. I would like her to d/c glucotrol due to low blood sugar.  [facility] if [facility] possible to cancel refill.

## 2015-01-16 NOTE — Patient Instructions (Addendum)
Stop ditropan, start myrbetriq.   You will be contacted about your referral to sports medicine (Dr. Pearletha ForgeHudnall). Please complete lab work prior to leaving.

## 2015-01-16 NOTE — Telephone Encounter (Signed)
Spoke with pt. She has eaten lunch around 11:30am after leaving our office. Reports that she felt like she was going to pass out when she had her labs drawn but did not tell anyone. Current BS is 84. Advised pt to continue to monitor BS readings and if having any other low readings to let us know as medication doses may need to be adjusted.

## 2015-01-16 NOTE — Telephone Encounter (Signed)
Reviewed EPIC, it appears that only myrbetriq was sent to mail order originally. I have faxed request to cancel eRx. [facility] and [facility] to [facility].

## 2015-01-16 NOTE — Assessment & Plan Note (Signed)
Uncontrolled, d/c ditropan, trial of mybetriq.

## 2015-01-16 NOTE — Progress Notes (Signed)
Pre visit review using our clinic review tool, if applicable. No additional management support is needed unless otherwise documented below in the visit note. 

## 2015-01-16 NOTE — Telephone Encounter (Signed)
Please contact patient and ask her to recheck her sugar now- lab said that sugar was 36.

## 2015-01-16 NOTE — Progress Notes (Signed)
Subjective:    Patient ID: Nicole Waters, female    DOB: 07/30/1945, 70 y.o.   MRN: [id number]  HPI  Ms. Nicole Waters is a 70 yr old female who presents today for follow up.  1) HTN- Bpt is currently maintained on lisinopril. Denies significant cough.  BP Readings from Last 3 Encounters:  01/16/15 118/71  10/16/14 119/71  08/03/14 122/82   2) Hyperlipidemia- on simvastatin 20mg . Denies myalgias.   Lab Results  Component Value Date   CHOL 109 08/03/2014   HDL 34.00* 08/03/2014   LDLCALC 57 08/03/2014   TRIG 92.0 08/03/2014   CHOLHDL 3 08/03/2014   3) DM2- currently maintained on glucotrol, actos. She reports that her home sugars 130's occasionally a bit higher.  Usually checks fasting.   Lab Results  Component Value Date   HGBA1C 8.4* 08/03/2014   Lab Results  Component Value Date   MICROALBUR <0.7 08/03/2014   LDLCALC 57 08/03/2014   CREATININE 0.82 10/16/2014   4) Overactive Bladder- notes slight improvement with ditropan. Ditropan dose was increased form bid to tid last visit.   5) Left shoulder/arm pain.  Worse at night. Occasional left sided neck pain.  Reports hirsuitism, neck since menopause.    Review of Systems    see HPI  Past Medical History  Diagnosis Date  . Hyperlipidemia   . Hypertension   . Diabetes mellitus without complication [facility](HCC)     Social History   Social History  . Marital Status: Widowed    Spouse Name: N/A  . Number of Children: N/A  . Years of Education: N/A   Occupational History  . Not on file.   Social History Main Topics  . Smoking status: Never Smoker   . Smokeless tobacco: Not on file  . Alcohol Use: No  . Drug Use: No  . Sexual Activity: Not on file   Other Topics Concern  . Not on file   Social History Narrative   Works part time in an in home daycare   Son- lives locally, he has 3 children   Daughter-lives locally- has 2 children   Lives alone   Completed [employer] for childcare       Past Surgical  History  Procedure Laterality Date  . Cholecystectomy  1980  . Leep surgery      Family History  Problem Relation Age of Onset  . Diabetes Mother   . Cancer Father     lung  . Heart disease Father   . Cancer Paternal Uncle     lung  . Diabetes Maternal Grandmother   . Heart disease Maternal Grandfather   . Cancer Paternal Grandmother     ovarian  . Heart disease Paternal Grandfather   . Diabetes Sister     died from DM complications  . Diabetes Sister     had ESRD, kidney failure, died from liver disease    Allergies  Allergen Reactions  . Sulfa Antibiotics Rash  . Metformin And Related     diarrhea    Current Outpatient Prescriptions on File Prior to Visit  Medication Sig Dispense Refill  . ACCU-CHEK FASTCLIX LANCETS MISC     . aspirin 81 MG tablet Take 81 mg by mouth daily.     No current facility-administered medications on file prior to visit.    BP 118/71 mmHg  Pulse 66  Temp(Src) 98 F (36.7 C) (Oral)  Resp 16  Ht 5\' 9"  (1.753 m)  Wt 199 lb (90.266 kg)  BMI 29.37 kg/m2  SpO2 100%    Objective:   Physical Exam  Constitutional: She is oriented to person, place, and time. She appears well-developed and well-nourished.  HENT:  Head: Normocephalic and atraumatic.  Cardiovascular: Normal rate, regular rhythm and normal heart sounds.   No murmur heard. Pulmonary/Chest: Effort normal and breath sounds normal. No respiratory distress. She has no wheezes.  Musculoskeletal: She exhibits no edema.  Neurological: She is alert and oriented to person, place, and time.  Skin:  Some hirsuitism on neck  Psychiatric: She has a normal mood and affect. Her behavior is normal. Judgment and thought content normal.          Assessment & Plan:

## 2015-01-16 NOTE — Telephone Encounter (Signed)
rx sent

## 2015-01-16 NOTE — Assessment & Plan Note (Signed)
Obtain testosterone levels. If elevated, consider referral to endocrinology.

## 2015-01-16 NOTE — Assessment & Plan Note (Signed)
LDL at goal, tolerating statin, continue same.  

## 2015-01-16 NOTE — Assessment & Plan Note (Signed)
BP stable on current meds. Continue same.  

## 2015-01-16 NOTE — Assessment & Plan Note (Signed)
Fair fasting sugars. Suspect post prandial hyperglycemia. Obtain follow up A1C, continue current mds.

## 2015-01-17 ENCOUNTER — Ambulatory Visit: Payer: Commercial Managed Care - HMO | Admitting: Family Medicine

## 2015-01-18 ENCOUNTER — Ambulatory Visit (INDEPENDENT_AMBULATORY_CARE_PROVIDER_SITE_OTHER): Payer: Commercial Managed Care - HMO | Admitting: Family Medicine

## 2015-01-18 ENCOUNTER — Encounter: Payer: Self-pay | Admitting: Family Medicine

## 2015-01-18 VITALS — BP 132/85 | HR 71 | Ht 69.0 in | Wt 199.0 lb

## 2015-01-18 DIAGNOSIS — M25512 Pain in left shoulder: Secondary | ICD-10-CM | POA: Diagnosis not present

## 2015-01-18 MED ORDER — METHYLPREDNISOLONE ACETATE 40 MG/ML IJ SUSP
40.0000 mg | Freq: Once | INTRAMUSCULAR | Status: AC
  Administered 2015-01-18: 40 mg via INTRA_ARTICULAR

## 2015-01-18 NOTE — Patient Instructions (Signed)
You have rotator cuff impingement Try to avoid painful activities (overhead activities, lifting with extended arm) as much as possible. Aleve 2 tabs twice a day with food OR ibuprofen 3 tabs three times a day with food for pain and inflammation for 7-10 days then as needed. Can take tylenol in addition to this. Subacromial injection may be beneficial to help with pain and to decrease inflammation. Consider physical therapy with transition to home exercise program. Do home exercise program with theraband and scapular stabilization exercises daily - these are very important for long term relief even if an injection was given.  3 sets of 10 once a day. If not improving at follow-up we will consider further imaging, injection, physical therapy, and/or nitro patches. Follow up with me in 6 weeks.

## 2015-01-18 NOTE — Telephone Encounter (Signed)
Received fax to verify previous Rxs sent to them. Spoke with Apolinar JunesBrandon at [facility] and verified that Glipizide and myrbetriq have been cancelled.

## 2015-01-18 NOTE — Telephone Encounter (Signed)
Pt called and verified that she received below recommendation and voices understanding.

## 2015-01-19 LAB — TESTOSTERONE, TOTAL, LC/MS/MS: TESTOSTERONE, TOTAL, LC-MS-MS: 13 ng/dL (ref 2–45)

## 2015-01-21 DIAGNOSIS — M25512 Pain in left shoulder: Secondary | ICD-10-CM | POA: Insufficient documentation

## 2015-01-21 NOTE — Progress Notes (Signed)
PCP and consultation requested by: Lemont Fillers., NP  Subjective:   HPI: Patient is a 70 y.o. female here for left shoulder pain.  Patient denies known injury or trauma. She states about 1 month ago she started to get lateral left shoulder pain. Pain bad enough at night that it can wake her up. Pain is sharp, 5/10 at rest up to 9/10 at nighttime. Worse with overhead motions and lying on left shoulder. Is left handed. No prior issues. Tried aleve, aspirin, icy hot.  Past Medical History  Diagnosis Date  . Hyperlipidemia   . Hypertension   . Diabetes mellitus without complication [facility])     Current Outpatient Prescriptions on File Prior to Visit  Medication Sig Dispense Refill  . ACCU-CHEK FASTCLIX LANCETS MISC     . ACCU-CHEK SMARTVIEW test strip Use to check blood sugar daily. 300 each 1  . aspirin 81 MG tablet Take 81 mg by mouth daily.    . fluticasone (FLONASE) 50 MCG/ACT nasal spray Place 2 sprays into both nostrils daily. 48 g 1  . insulin NPH Human (NOVOLIN N RELION) 100 UNIT/ML injection Inject 0.18 mLs (18 Units total) into the skin 2 (two) times daily before a meal. 10 mL 5  . lisinopril (PRINIVIL,ZESTRIL) 20 MG tablet Take 1 tablet (20 mg total) by mouth daily. 90 tablet 1  . metoprolol succinate (TOPROL-XL) 25 MG 24 hr tablet Take 1 tablet (25 mg total) by mouth 2 (two) times daily. 180 tablet 1  . mirabegron ER (MYRBETRIQ) 25 MG TB24 tablet Take 1 tablet (25 mg total) by mouth daily. 30 tablet 5  . pioglitazone (ACTOS) 15 MG tablet Take 1 tablet (15 mg total) by mouth daily. 90 tablet 1  . simvastatin (ZOCOR) 20 MG tablet Take 1 tablet (20 mg total) by mouth every evening. 90 tablet 1   No current facility-administered medications on file prior to visit.    Past Surgical History  Procedure Laterality Date  . Cholecystectomy  1980  . Leep surgery      Allergies  Allergen Reactions  . Sulfa Antibiotics Rash  . Metformin And Related     diarrhea     Social History   Social History  . Marital Status: Widowed    Spouse Name: N/A  . Number of Children: N/A  . Years of Education: N/A   Occupational History  . Not on file.   Social History Main Topics  . Smoking status: Never Smoker   . Smokeless tobacco: Not on file  . Alcohol Use: No  . Drug Use: No  . Sexual Activity: Not on file   Other Topics Concern  . Not on file   Social History Narrative   Works part time in an in home daycare   Son- lives locally, he has 3 children   Daughter-lives locally- has 2 children   Lives alone   Completed [employer] for childcare       Family History  Problem Relation Age of Onset  . Diabetes Mother   . Cancer Father     lung  . Heart disease Father   . Cancer Paternal Uncle     lung  . Diabetes Maternal Grandmother   . Heart disease Maternal Grandfather   . Cancer Paternal Grandmother     ovarian  . Heart disease Paternal Grandfather   . Diabetes Sister     died from DM complications  . Diabetes Sister     had ESRD, kidney failure, died from  liver disease    BP 132/85 mmHg  Pulse 71  Ht 5\' 9"  (1.753 m)  Wt 199 lb (90.266 kg)  BMI 29.37 kg/m2  Review of Systems: See HPI above.    Objective:  Physical Exam:  Gen: NAD  Left shoulder: No swelling, ecchymoses.  No gross deformity. No TTP. FROM with painful arc Positive Juanetta GoslingHawkins, Neers. Negative Speeds, Yergasons. Strength 5/5 with empty can and resisted internal/external rotation.  Pain empty can, less ER. Negative apprehension. NV intact distally.  Right shoulder: FROM without pain.    Assessment & Plan:  1. Left shoulder rotator cuff impingement.  Shown home exercises to do daily.  She opted for subacromial injection as well.  Icing, nsaids if needed.  Consider physical therapy, nitro patches, imaging if not improving.  F/u in 6 weeks.  After informed written consent, patient was seated on exam table. Left shoulder was prepped with alcohol swab and  utilizing posterior approach, patient's left subacromial space was injected with 3:1 marcaine: depomedrol. Patient tolerated the procedure well without immediate complications.

## 2015-01-21 NOTE — Assessment & Plan Note (Signed)
Shown home exercises to do daily.  She opted for subacromial injection as well.  Icing, nsaids if needed.  Consider physical therapy, nitro patches, imaging if not improving.  F/u in 6 weeks.  After informed written consent, patient was seated on exam table. Left shoulder was prepped with alcohol swab and utilizing posterior approach, patient's left subacromial space was injected with 3:1 marcaine: depomedrol. Patient tolerated the procedure well without immediate complications.

## 2015-01-22 LAB — DHEA: DHEA: 99 ng/dL — AB (ref 102–1185)

## 2015-02-04 ENCOUNTER — Telehealth: Payer: Self-pay | Admitting: Family

## 2015-02-04 NOTE — Telephone Encounter (Signed)
Caller name: Self  Can be reached: [phone]   Reason for call: Patient states she gives herself insulin in her stomach and now has a purple spot at the injection sight. Wants to know if this is normal

## 2015-02-05 NOTE — Telephone Encounter (Signed)
Spoke with pt, she states she does not normally bruise with her injections. Advised pt that sometimes bruising may occur but she should let us know if it becomes more frequent. Pt voices understanding. Please advise if any further instructions?

## 2015-02-05 NOTE — Telephone Encounter (Signed)
No further recs. Thanks

## 2015-02-20 ENCOUNTER — Ambulatory Visit: Payer: Commercial Managed Care - HMO | Admitting: Family

## 2015-02-26 ENCOUNTER — Telehealth: Payer: Self-pay | Admitting: Family

## 2015-02-26 NOTE — Telephone Encounter (Signed)
Caller name: Brenetta   Relationship to patient: Self   Can be reached: [phone]  Reason for call: pt says that [facility] called her stating that a form was faxed over to our office. Pt says that they provided her with a fax number to send form back to : [phone]. Pt says that [facility] had questions about pt's medication metoprolol succinate

## 2015-02-28 ENCOUNTER — Ambulatory Visit: Payer: Commercial Managed Care - HMO | Admitting: Family Medicine

## 2015-03-06 NOTE — Telephone Encounter (Signed)
I have not received this form, I will process in the order of the received PAs when it comes in; please do not forget to tell patients when they call that Insurance approval can take up to 4 weeks/SLS 03/01 Thanks.

## 2015-04-12 ENCOUNTER — Ambulatory Visit: Payer: Commercial Managed Care - HMO | Admitting: Family

## 2015-05-14 ENCOUNTER — Ambulatory Visit (INDEPENDENT_AMBULATORY_CARE_PROVIDER_SITE_OTHER): Payer: Commercial Managed Care - HMO | Admitting: Family

## 2015-05-14 ENCOUNTER — Encounter: Payer: Self-pay | Admitting: Family

## 2015-05-14 VITALS — BP 115/62 | HR 64 | Temp 98.0°F | Ht 69.0 in | Wt 202.0 lb

## 2015-05-14 DIAGNOSIS — N3281 Overactive bladder: Secondary | ICD-10-CM | POA: Diagnosis not present

## 2015-05-14 DIAGNOSIS — E119 Type 2 diabetes mellitus without complications: Secondary | ICD-10-CM

## 2015-05-14 DIAGNOSIS — E1165 Type 2 diabetes mellitus with hyperglycemia: Secondary | ICD-10-CM

## 2015-05-14 DIAGNOSIS — IMO0001 Reserved for inherently not codable concepts without codable children: Secondary | ICD-10-CM

## 2015-05-14 DIAGNOSIS — I10 Essential (primary) hypertension: Secondary | ICD-10-CM | POA: Diagnosis not present

## 2015-05-14 DIAGNOSIS — Z794 Long term (current) use of insulin: Secondary | ICD-10-CM

## 2015-05-14 LAB — BASIC METABOLIC PANEL
BUN: 15 mg/dL (ref 6–23)
CO2: 27 meq/L (ref 19–32)
Calcium: 9.1 mg/dL (ref 8.4–10.5)
Chloride: 103 mEq/L (ref 96–112)
Creatinine, Ser: 0.78 mg/dL (ref 0.40–1.20)
GFR: 94.32 mL/min (ref >60.00)
GLUCOSE: 176 mg/dL — AB (ref 70–99)
POTASSIUM: 4.4 meq/L (ref 3.5–5.1)
SODIUM: 138 meq/L (ref 135–145)

## 2015-05-14 LAB — HEMOGLOBIN A1C: HEMOGLOBIN A1C: 8 % — AB (ref 4.6–6.5)

## 2015-05-14 MED ORDER — INSULIN NPH (HUMAN) (ISOPHANE) 100 UNIT/ML ~~LOC~~ SUSP: 16.0000 [IU] | Freq: Two times a day (BID) | SUBCUTANEOUS | Status: DC

## 2015-05-14 MED ORDER — PIOGLITAZONE HCL 30 MG PO TABS: 30.0000 mg | ORAL_TABLET | Freq: Every day | ORAL | Status: DC

## 2015-05-14 NOTE — Assessment & Plan Note (Signed)
bp is stable on lisinopril. Continue same.

## 2015-05-14 NOTE — Patient Instructions (Addendum)
Add claritin 10mg  once daily. Please call [employer] and ask them if there are insulins that are a lower copay than the novolin that you are taking.  Then let me know and we can see about switching your insulin.

## 2015-05-14 NOTE — Assessment & Plan Note (Signed)
She will try to pick up myrbetriq if she is able to afford it.

## 2015-05-14 NOTE — Progress Notes (Signed)
Subjective:    Patient ID: Nicole PontoLaverne Waters, female    DOB: November 08, 1945, 70 y.o.   MRN: [id number]  HPI   Ms. Nicole Waters is a 70 yr old female who presents today for follow up of multiple medical problems.  1) DM2- current meds include NPH, 18 units BID, actos. Highest sugar 160.  Reports that she had one sugar was down to 50. Reports that she has not had her insulin in 1 month.   Lab Results  Component Value Date   HGBA1C 6.5 01/16/2015   HGBA1C 8.4* 08/03/2014   Lab Results  Component Value Date   MICROALBUR <0.7 08/03/2014   LDLCALC 57 08/03/2014   CREATININE 0.77 01/16/2015   2) Overactive bladder- reports that she never picked up myrbetriq. Continues to have bladder frequency.   3) HTN- current bp meds include lisinopril 20mg ,  BP Readings from Last 3 Encounters:  05/14/15 115/62  01/18/15 132/85  01/16/15 118/71       Review of Systems  Respiratory: Negative for shortness of breath.   Cardiovascular: Negative for chest pain and leg swelling.   Past Medical History  Diagnosis Date  . Hyperlipidemia   . Hypertension   . Diabetes mellitus without complication [facility](HCC)      Social History   Social History  . Marital Status: Widowed    Spouse Name: N/A  . Number of Children: N/A  . Years of Education: N/A   Occupational History  . Not on file.   Social History Main Topics  . Smoking status: Never Smoker   . Smokeless tobacco: Not on file  . Alcohol Use: No  . Drug Use: No  . Sexual Activity: Not on file   Other Topics Concern  . Not on file   Social History Narrative   Works part time in an in home daycare   Son- lives locally, he has 3 children   Daughter-lives locally- has 2 children   Lives alone   Completed [employer] for childcare       Past Surgical History  Procedure Laterality Date  . Cholecystectomy  1980  . Leep surgery      Family History  Problem Relation Age of Onset  . Diabetes Mother   . Cancer Father     lung  . Heart  disease Father   . Cancer Paternal Uncle     lung  . Diabetes Maternal Grandmother   . Heart disease Maternal Grandfather   . Cancer Paternal Grandmother     ovarian  . Heart disease Paternal Grandfather   . Diabetes Sister     died from DM complications  . Diabetes Sister     had ESRD, kidney failure, died from liver disease    Allergies  Allergen Reactions  . Sulfa Antibiotics Rash  . Metformin And Related     diarrhea    Current Outpatient Prescriptions on File Prior to Visit  Medication Sig Dispense Refill  . ACCU-CHEK FASTCLIX LANCETS MISC     . ACCU-CHEK SMARTVIEW test strip Use to check blood sugar daily. 300 each 1  . aspirin 81 MG tablet Take 81 mg by mouth daily.    . fluticasone (FLONASE) 50 MCG/ACT nasal spray Place 2 sprays into both nostrils daily. 48 g 1  . lisinopril (PRINIVIL,ZESTRIL) 20 MG tablet Take 1 tablet (20 mg total) by mouth daily. 90 tablet 1  . metoprolol succinate (TOPROL-XL) 25 MG 24 hr tablet Take 1 tablet (25 mg total) by mouth 2 (two)  times daily. 180 tablet 1  . simvastatin (ZOCOR) 20 MG tablet Take 1 tablet (20 mg total) by mouth every evening. 90 tablet 1  . insulin NPH Human (NOVOLIN N RELION) 100 UNIT/ML injection Inject 0.18 mLs (18 Units total) into the skin 2 (two) times daily before a meal. (Patient not taking: Reported on 05/14/2015) 10 mL 5  . mirabegron ER (MYRBETRIQ) 25 MG TB24 tablet Take 1 tablet (25 mg total) by mouth daily. (Patient not taking: Reported on 05/14/2015) 30 tablet 5   No current facility-administered medications on file prior to visit.    BP 115/62 mmHg  Pulse 64  Temp(Src) 98 F (36.7 C) (Oral)  Ht  (1.753 m)  Wt 202 lb (91.627 kg)  BMI 29.82 kg/m2  SpO2 100%       Objective:   Physical Exam  Constitutional: She appears well-developed and well-nourished.  Cardiovascular: Normal rate, regular rhythm and normal heart sounds.   No murmur heard. Pulmonary/Chest: Effort normal and breath sounds normal.  No respiratory distress. She has no wheezes.  Musculoskeletal: She exhibits no edema.  Psychiatric: She has a normal mood and affect. Her behavior is normal. Judgment and thought content normal.          Assessment & Plan:

## 2015-05-14 NOTE — Assessment & Plan Note (Signed)
She is having trouble affording her insulin.  I have advised her to check with her insurance to see if there are some less expensive insulins.  In the meantime, advised pt to increase actos from 15 to 30mg .  I have advised her that if she does start back with the insulin to decrease dose from 18 units bid to 16 units bid.  She is advised to let me know if she has any further hypoglycemia. Will obtain A1C, bmet.

## 2015-05-14 NOTE — Progress Notes (Signed)
Pre visit review using our clinic review tool, if applicable. No additional management support is needed unless otherwise documented below in the visit note. 

## 2015-06-04 ENCOUNTER — Other Ambulatory Visit: Payer: Self-pay | Admitting: Family

## 2015-08-14 ENCOUNTER — Ambulatory Visit (INDEPENDENT_AMBULATORY_CARE_PROVIDER_SITE_OTHER): Payer: Commercial Managed Care - HMO | Admitting: Family

## 2015-08-14 VITALS — BP 122/72 | HR 56 | Temp 97.9°F | Resp 17 | Ht 69.0 in | Wt 203.0 lb

## 2015-08-14 DIAGNOSIS — E1165 Type 2 diabetes mellitus with hyperglycemia: Secondary | ICD-10-CM

## 2015-08-14 DIAGNOSIS — I10 Essential (primary) hypertension: Secondary | ICD-10-CM

## 2015-08-14 DIAGNOSIS — N3281 Overactive bladder: Secondary | ICD-10-CM

## 2015-08-14 DIAGNOSIS — E785 Hyperlipidemia, unspecified: Secondary | ICD-10-CM

## 2015-08-14 DIAGNOSIS — Z794 Long term (current) use of insulin: Secondary | ICD-10-CM | POA: Diagnosis not present

## 2015-08-14 DIAGNOSIS — IMO0001 Reserved for inherently not codable concepts without codable children: Secondary | ICD-10-CM

## 2015-08-14 DIAGNOSIS — E119 Type 2 diabetes mellitus without complications: Secondary | ICD-10-CM | POA: Diagnosis not present

## 2015-08-14 LAB — LIPID PANEL
CHOLESTEROL: 124 mg/dL (ref 0–200)
HDL: 39.4 mg/dL (ref >39.00)
LDL CALC: 61 mg/dL (ref 0–99)
NonHDL: 84.91
TRIGLYCERIDES: 119 mg/dL (ref 0.0–149.0)
Total CHOL/HDL Ratio: 3
VLDL: 23.8 mg/dL (ref 0.0–40.0)

## 2015-08-14 LAB — BASIC METABOLIC PANEL
BUN: 10 mg/dL (ref 6–23)
CO2: 28 mEq/L (ref 19–32)
CREATININE: 0.79 mg/dL (ref 0.40–1.20)
Calcium: 9.6 mg/dL (ref 8.4–10.5)
Chloride: 105 mEq/L (ref 96–112)
GFR: 92.87 mL/min (ref >60.00)
Glucose, Bld: 96 mg/dL (ref 70–99)
POTASSIUM: 3.8 meq/L (ref 3.5–5.1)
Sodium: 139 mEq/L (ref 135–145)

## 2015-08-14 LAB — HEPATIC FUNCTION PANEL
ALT: 17 U/L (ref 0–35)
AST: 24 U/L (ref 0–37)
Albumin: 3.9 g/dL (ref 3.5–5.2)
Alkaline Phosphatase: 45 U/L (ref 39–117)
BILIRUBIN DIRECT: 0.1 mg/dL (ref 0.0–0.3)
BILIRUBIN TOTAL: 0.4 mg/dL (ref 0.2–1.2)
Total Protein: 7.5 g/dL (ref 6.0–8.3)

## 2015-08-14 LAB — HEMOGLOBIN A1C: Hgb A1c MFr Bld: 7.4 % — ABNORMAL HIGH (ref 4.6–6.5)

## 2015-08-14 MED ORDER — MIRABEGRON ER 25 MG PO TB24: 25.0000 mg | ORAL_TABLET | Freq: Every day | ORAL | 5 refills | Status: DC

## 2015-08-14 MED ORDER — PIOGLITAZONE HCL 30 MG PO TABS: 30.0000 mg | ORAL_TABLET | Freq: Every day | ORAL | 5 refills | Status: DC

## 2015-08-14 NOTE — Progress Notes (Signed)
Pre visit review using our clinic review tool, if applicable. No additional management support is needed unless otherwise documented below in the visit note. 

## 2015-08-14 NOTE — Assessment & Plan Note (Signed)
Improved control per pt report. Will obtain follow up A1c.

## 2015-08-14 NOTE — Assessment & Plan Note (Signed)
Advised trial of myrbetriq. Uncontrolled.

## 2015-08-14 NOTE — Patient Instructions (Addendum)
Please complete lab work prior to leaving. You can add tylenol as needed for knee pain- let me know if this does not help.

## 2015-08-14 NOTE — Assessment & Plan Note (Signed)
Tolerating statin, obtain lipid panel.  

## 2015-08-14 NOTE — Assessment & Plan Note (Signed)
Stable on current meds.  Continue same. 

## 2015-08-14 NOTE — Progress Notes (Signed)
Subjective:    Patient ID: Nicole Waters, female    DOB: 1945/08/06, 70 y.o.   MRN: [id number]  HPI  Mr Bilger is a 70 yr old female who presents today for follow up.  1) HTN-  Maintained on metoprolol.   BP Readings from Last 3 Encounters:  08/14/15 122/72  05/14/15 115/62  01/18/15 132/85   2) OAB- She does not believe that she is taking myrbetriq. Reports that she gets up "about 10 times a night."    3) DM2- on actos and NPH 16 units bid. She reports that she is checking sugars. This AM was 116.  Generally <200.   Lab Results  Component Value Date   HGBA1C 8.0 (H) 05/14/2015   HGBA1C 6.5 01/16/2015   HGBA1C 8.4 (H) 08/03/2014   Lab Results  Component Value Date   MICROALBUR <0.7 08/03/2014   LDLCALC 57 08/03/2014   CREATININE 0.78 05/14/2015   4) Hyperlipidemia- maintained on simvastatin.   Lab Results  Component Value Date   CHOL 109 08/03/2014   HDL 34.00 (L) 08/03/2014   LDLCALC 57 08/03/2014   TRIG 92.0 08/03/2014   CHOLHDL 3 08/03/2014      Review of Systems See HPI  Past Medical History:  Diagnosis Date  . Diabetes mellitus without complication (HCC)   . Hyperlipidemia   . Hypertension      Social History   Social History  . Marital status: Widowed    Spouse name: N/A  . Number of children: N/A  . Years of education: N/A   Occupational History  . Not on file.   Social History Main Topics  . Smoking status: Never Smoker  . Smokeless tobacco: Not on file  . Alcohol use No  . Drug use: No  . Sexual activity: Not on file   Other Topics Concern  . Not on file   Social History Narrative   Works part time in an in home daycare   Son- lives locally, he has 3 children   Daughter-lives locally- has 2 children   Lives alone   Completed [employer] for childcare       Past Surgical History:  Procedure Laterality Date  . CHOLECYSTECTOMY  1980  . leep surgery      Family History  Problem Relation Age of Onset  . Diabetes Mother    . Cancer Father     lung  . Heart disease Father   . Cancer Paternal Uncle     lung  . Diabetes Maternal Grandmother   . Heart disease Maternal Grandfather   . Cancer Paternal Grandmother     ovarian  . Heart disease Paternal Grandfather   . Diabetes Sister     died from DM complications  . Diabetes Sister     had ESRD, kidney failure, died from liver disease    Allergies  Allergen Reactions  . Sulfa Antibiotics Rash  . Metformin And Related     diarrhea    Current Outpatient Prescriptions on File Prior to Visit  Medication Sig Dispense Refill  . ACCU-CHEK FASTCLIX LANCETS MISC     . ACCU-CHEK SMARTVIEW test strip Use to check blood sugar daily. 300 each 1  . aspirin 81 MG tablet Take 81 mg by mouth daily.    . fluticasone (FLONASE) 50 MCG/ACT nasal spray USE 2 SPRAYS IN EACH NOSTRIL EVERY DAY 48 g 1  . insulin NPH Human (NOVOLIN N RELION) 100 UNIT/ML injection Inject 0.16 mLs (16 Units total) into the  skin 2 (two) times daily before a meal. 10 mL 5  . lisinopril (PRINIVIL,ZESTRIL) 20 MG tablet Take 1 tablet (20 mg total) by mouth daily. 90 tablet 1  . metoprolol succinate (TOPROL-XL) 25 MG 24 hr tablet TAKE 1 TABLET TWICE DAILY 180 tablet 1  . simvastatin (ZOCOR) 20 MG tablet TAKE 1 TABLET  EVERY EVENING. 90 tablet 1   No current facility-administered medications on file prior to visit.     BP 122/72 (BP Location: Right Arm, Patient Position: Sitting, Cuff Size: Large)   Pulse (!) 56   Temp 97.9 F (36.6 C) (Oral)   Resp 17   Ht 5\' 9"  (1.753 m)   Wt 203 lb (92.1 kg)   SpO2 98%   BMI 29.98 kg/m       Objective:   Physical Exam  Constitutional: She is oriented to person, place, and time. She appears well-developed and well-nourished.  Cardiovascular: Normal rate, regular rhythm and normal heart sounds.   No murmur heard. Pulmonary/Chest: Effort normal and breath sounds normal. No respiratory distress. She has no wheezes.  Musculoskeletal: She exhibits no  edema.  Neurological: She is alert and oriented to person, place, and time.  Skin: Skin is warm and dry.  Psychiatric: She has a normal mood and affect. Her behavior is normal. Judgment and thought content normal.          Assessment & Plan:

## 2015-08-15 ENCOUNTER — Telehealth (INDEPENDENT_AMBULATORY_CARE_PROVIDER_SITE_OTHER): Payer: Commercial Managed Care - HMO | Admitting: Family

## 2015-08-15 DIAGNOSIS — R300 Dysuria: Secondary | ICD-10-CM

## 2015-08-15 NOTE — Telephone Encounter (Signed)
OK, UA and culture please. Dx dysuria

## 2015-08-15 NOTE — Telephone Encounter (Signed)
Called patients with orders regarding increasing her NPH from 16 U to 18U. Patient agreed. Patient states she has noticed that she has burning sensation when she urinates and still has frequency. Patient would like to know if she cn come in for U/A only.

## 2015-08-15 NOTE — Telephone Encounter (Signed)
Sugar control is improving, but still slightly above goal. I would recommend that she increase NPH from 16 units bid to 18 units bid.

## 2015-08-16 ENCOUNTER — Other Ambulatory Visit: Payer: Commercial Managed Care - HMO

## 2015-08-16 DIAGNOSIS — R300 Dysuria: Secondary | ICD-10-CM | POA: Diagnosis not present

## 2015-08-16 LAB — POCT URINALYSIS DIPSTICK
Bilirubin, UA: NEGATIVE
GLUCOSE UA: NEGATIVE
Ketones, UA: NEGATIVE
NITRITE UA: NEGATIVE
Protein, UA: NEGATIVE
RBC UA: NEGATIVE
Spec Grav, UA: >=1.03
UROBILINOGEN UA: NEGATIVE
pH, UA: 6

## 2015-08-16 MED ORDER — CIPROFLOXACIN HCL 500 MG PO TABS: 500.0000 mg | ORAL_TABLET | Freq: Two times a day (BID) | ORAL | 0 refills | Status: DC

## 2015-08-16 NOTE — Telephone Encounter (Signed)
Please see POCT u/a result and advise? Urine sent for culture.

## 2015-08-16 NOTE — Addendum Note (Signed)
Addended by: Mervin KungFERGERSON, Sadonna Kotara A on: 08/16/2015 03:44 PM   Modules accepted: Orders

## 2015-08-16 NOTE — Telephone Encounter (Signed)
Per verbal from PCP, ok to send Cipro  twice a day for 3 days. Rx sent and pt notified.

## 2015-08-16 NOTE — Telephone Encounter (Signed)
Notified pt and scheduled lab appt for today at 1:30pm. Future orders entered.

## 2015-08-16 NOTE — Telephone Encounter (Signed)
Pt came in and  Completed urine.    CB: [phone]   [facility]: [facility] [address] [place], [place] - [id number]

## 2015-08-17 LAB — URINE CULTURE: ORGANISM ID, BACTERIA: NO GROWTH

## 2015-10-15 ENCOUNTER — Other Ambulatory Visit: Payer: Self-pay | Admitting: Family

## 2015-11-07 ENCOUNTER — Other Ambulatory Visit: Payer: Self-pay | Admitting: Family

## 2015-11-15 ENCOUNTER — Ambulatory Visit: Payer: Commercial Managed Care - HMO | Admitting: Family

## 2015-11-18 ENCOUNTER — Ambulatory Visit (INDEPENDENT_AMBULATORY_CARE_PROVIDER_SITE_OTHER): Payer: Commercial Managed Care - HMO | Admitting: Family

## 2015-11-18 ENCOUNTER — Encounter: Payer: Self-pay | Admitting: Family

## 2015-11-18 VITALS — BP 106/82 | HR 100 | Temp 97.7°F | Resp 16 | Ht 69.0 in | Wt 201.6 lb

## 2015-11-18 DIAGNOSIS — IMO0001 Reserved for inherently not codable concepts without codable children: Secondary | ICD-10-CM

## 2015-11-18 DIAGNOSIS — E1165 Type 2 diabetes mellitus with hyperglycemia: Secondary | ICD-10-CM | POA: Diagnosis not present

## 2015-11-18 DIAGNOSIS — E785 Hyperlipidemia, unspecified: Secondary | ICD-10-CM

## 2015-11-18 DIAGNOSIS — Z794 Long term (current) use of insulin: Secondary | ICD-10-CM | POA: Diagnosis not present

## 2015-11-18 DIAGNOSIS — Z23 Encounter for immunization: Secondary | ICD-10-CM

## 2015-11-18 DIAGNOSIS — I10 Essential (primary) hypertension: Secondary | ICD-10-CM | POA: Diagnosis not present

## 2015-11-18 DIAGNOSIS — N3281 Overactive bladder: Secondary | ICD-10-CM

## 2015-11-18 LAB — BASIC METABOLIC PANEL
BUN: 14 mg/dL (ref 6–23)
CALCIUM: 9.3 mg/dL (ref 8.4–10.5)
CO2: 26 meq/L (ref 19–32)
CREATININE: 0.82 mg/dL (ref 0.40–1.20)
Chloride: 104 mEq/L (ref 96–112)
GFR: 88.89 mL/min (ref >60.00)
GLUCOSE: 121 mg/dL — AB (ref 70–99)
Potassium: 4.2 mEq/L (ref 3.5–5.1)
Sodium: 138 mEq/L (ref 135–145)

## 2015-11-18 LAB — HEMOGLOBIN A1C: HEMOGLOBIN A1C: 7.9 % — AB (ref 4.6–6.5)

## 2015-11-18 MED ORDER — LISINOPRIL 20 MG PO TABS: 10.0000 mg | ORAL_TABLET | Freq: Every day | ORAL | 1 refills | Status: DC

## 2015-11-18 MED ORDER — MIRABEGRON ER 50 MG PO TB24: 50.0000 mg | ORAL_TABLET | Freq: Every day | ORAL | 5 refills | Status: DC

## 2015-11-18 NOTE — Assessment & Plan Note (Signed)
BP a bit overtreated.  Will decrease lisinpril from 20mg  to 10mg .

## 2015-11-18 NOTE — Assessment & Plan Note (Signed)
At goal on statin, continue same.

## 2015-11-18 NOTE — Progress Notes (Signed)
Subjective:    Patient ID: Nicole Waters, female    DOB: Jun 26, 1945, 70 y.o.   MRN: [id number]  HPI  Nicole Waters is a 70 yr old female who presents today for follow up.  1) DM2- maintained on NPH 18 units bid + actos. Sugars are "up and down." Denies hypoglycemia.   Lab Results  Component Value Date   HGBA1C 7.4 (H) 08/14/2015   HGBA1C 8.0 (H) 05/14/2015   HGBA1C 6.5 01/16/2015   Lab Results  Component Value Date   MICROALBUR <0.7 08/03/2014   LDLCALC 61 08/14/2015   CREATININE 0.79 08/14/2015   2) Overactive Bladder- last visit she was given a trial of myrbetriq. Reports that the myrbetriq has  Not significantly helped her symptoms.    3) Hypertension-  Maintained on toprol xl, lisinopril.  She reports occasional dizziness.   BP Readings from Last 3 Encounters:  11/18/15 106/82  08/14/15 122/72  05/14/15 115/62    4) Hyperlipidemia- mainatined on simvastin.  Denies myalgia.  Lab Results  Component Value Date   CHOL 124 08/14/2015   HDL 39.40 08/14/2015   LDLCALC 61 08/14/2015   TRIG 119.0 08/14/2015   CHOLHDL 3 08/14/2015      Review of Systems See HPI  Past Medical History:  Diagnosis Date  . Diabetes mellitus without complication (HCC)   . Hyperlipidemia   . Hypertension      Social History   Social History  . Marital status: Widowed    Spouse name: N/A  . Number of children: N/A  . Years of education: N/A   Occupational History  . Not on file.   Social History Main Topics  . Smoking status: Never Smoker  . Smokeless tobacco: Not on file  . Alcohol use No  . Drug use: No  . Sexual activity: Not on file   Other Topics Concern  . Not on file   Social History Narrative   Works part time in an in home daycare   Son- lives locally, he has 3 children   Daughter-lives locally- has 2 children   Lives alone   Completed [employer] for childcare       Past Surgical History:  Procedure Laterality Date  . CHOLECYSTECTOMY  1980  . leep  surgery      Family History  Problem Relation Age of Onset  . Diabetes Mother   . Cancer Father     lung  . Heart disease Father   . Cancer Paternal Uncle     lung  . Diabetes Maternal Grandmother   . Heart disease Maternal Grandfather   . Cancer Paternal Grandmother     ovarian  . Heart disease Paternal Grandfather   . Diabetes Sister     died from DM complications  . Diabetes Sister     had ESRD, kidney failure, died from liver disease    Allergies  Allergen Reactions  . Sulfa Antibiotics Rash  . Metformin And Related     diarrhea    Current Outpatient Prescriptions on File Prior to Visit  Medication Sig Dispense Refill  . ACCU-CHEK FASTCLIX LANCETS MISC     . ACCU-CHEK SMARTVIEW test strip Use to check blood sugar daily. 300 each 1  . aspirin 81 MG tablet Take 81 mg by mouth daily.    . fluticasone (FLONASE) 50 MCG/ACT nasal spray USE 2 SPRAYS IN EACH NOSTRIL EVERY DAY 48 g 1  . insulin NPH Human (NOVOLIN N RELION) 100 UNIT/ML injection Inject 0.16 mLs (  16 Units total) into the skin 2 (two) times daily before a meal. (Patient taking differently: Inject 18 Units into the skin 2 (two) times daily before a meal. ) 10 mL 5  . lisinopril (PRINIVIL,ZESTRIL) 20 MG tablet TAKE 1 TABLET EVERY DAY 90 tablet 1  . metoprolol succinate (TOPROL-XL) 25 MG 24 hr tablet TAKE 1 TABLET TWICE DAILY 180 tablet 1  . pioglitazone (ACTOS) 30 MG tablet Take 1 tablet (30 mg total) by mouth daily. 30 tablet 5  . simvastatin (ZOCOR) 20 MG tablet TAKE 1 TABLET  EVERY EVENING 90 tablet 1   No current facility-administered medications on file prior to visit.     BP 106/82 (BP Location: Right Arm, Patient Position: Sitting, Cuff Size: Normal)   Pulse 100   Temp 97.7 F (36.5 C) (Oral)   Resp 16   Ht 5\' 9"  (1.753 m)   Wt 201 lb 9.6 oz (91.4 kg)   SpO2 100% Comment: RA  BMI 29.77 kg/m       Objective:   Physical Exam  Constitutional: She appears well-developed and well-nourished.  HENT:    Head: Normocephalic and atraumatic.  Cardiovascular: Normal rate, regular rhythm and normal heart sounds.   No murmur heard. Pulmonary/Chest: Effort normal and breath sounds normal. No respiratory distress. She has no wheezes.  Musculoskeletal: She exhibits no edema.  Skin: Skin is warm and dry.  Psychiatric: She has a normal mood and affect. Her behavior is normal. Judgment and thought content normal.          Assessment & Plan:

## 2015-11-18 NOTE — Addendum Note (Signed)
Addended by: Mervin KungFERGERSON, Pansey Pinheiro A on: 11/18/2015 07:29 PM   Modules accepted: Orders

## 2015-11-18 NOTE — Patient Instructions (Addendum)
Please increase myrbetriq from 25mg  to 50mg  once daily. Decrease lisinopril form 20mg  to 10mg  (1/2 tablet) by mouth once daily.

## 2015-11-18 NOTE — Assessment & Plan Note (Signed)
Uncontrolled. Will increase myrbetriq form 25mg  to 50mg .

## 2015-11-18 NOTE — Assessment & Plan Note (Signed)
Obtain follow up a1c.  

## 2015-11-19 ENCOUNTER — Telehealth: Payer: Self-pay | Admitting: Family

## 2015-11-19 DIAGNOSIS — Z794 Long term (current) use of insulin: Principal | ICD-10-CM

## 2015-11-19 DIAGNOSIS — E119 Type 2 diabetes mellitus without complications: Secondary | ICD-10-CM

## 2015-11-19 NOTE — Telephone Encounter (Signed)
Sugar remains above goal. I would like her to meet with endocrinology.

## 2015-11-20 NOTE — Telephone Encounter (Signed)
Notified pt and she is agreeable to proceed with referral. Needs appt on a Monday.

## 2015-12-08 NOTE — Progress Notes (Signed)
Subjective:    Patient ID: Nicole Waters, female    DOB: 09-Oct-1945, 70 y.o.   MRN: [id number]  HPI pt is referred by Sandford CrazeMelissa O'Sullivan, NP, for diabetes.  Pt states DM was dx'ed in 1995; she has mild neuropathy of the lower extremities; she is unaware of any associated chronic complications; she has been on insulin since 2015; pt says her diet is good, but exercise is not good; she has never had GDM, pancreatitis, severe hypoglycemia or DKA.  She says cbg's are in the high-100's.  There is no trend throughout the day.  She takes 18 units BID.   Past Medical History:  Diagnosis Date  . Diabetes mellitus without complication (HCC)   . Hyperlipidemia   . Hypertension     Past Surgical History:  Procedure Laterality Date  . CHOLECYSTECTOMY  1980  . leep surgery      Social History   Social History  . Marital status: Widowed    Spouse name: N/A  . Number of children: N/A  . Years of education: N/A   Occupational History  . Not on file.   Social History Main Topics  . Smoking status: Never Smoker  . Smokeless tobacco: Not on file  . Alcohol use No  . Drug use: No  . Sexual activity: Not on file   Other Topics Concern  . Not on file   Social History Narrative   Works part time in an in home daycare   Son- lives locally, he has 3 children   Daughter-lives locally- has 2 children   Lives alone   Completed [employer] for childcare       Current Outpatient Prescriptions on File Prior to Visit  Medication Sig Dispense Refill  . ACCU-CHEK FASTCLIX LANCETS MISC     . ACCU-CHEK SMARTVIEW test strip Use to check blood sugar daily. 300 each 1  . aspirin 81 MG tablet Take 81 mg by mouth daily.    . fluticasone (FLONASE) 50 MCG/ACT nasal spray USE 2 SPRAYS IN EACH NOSTRIL EVERY DAY 48 g 1  . lisinopril (PRINIVIL,ZESTRIL) 20 MG tablet Take 0.5 tablets (10 mg total) by mouth daily. 45 tablet 1  . metoprolol succinate (TOPROL-XL) 25 MG 24 hr tablet TAKE 1 TABLET TWICE DAILY 180  tablet 1  . mirabegron ER (MYRBETRIQ) 50 MG TB24 tablet Take 1 tablet (50 mg total) by mouth daily. 30 tablet 5  . pioglitazone (ACTOS) 30 MG tablet Take 1 tablet (30 mg total) by mouth daily. 30 tablet 5  . simvastatin (ZOCOR) 20 MG tablet TAKE 1 TABLET  EVERY EVENING 90 tablet 1   No current facility-administered medications on file prior to visit.     Allergies  Allergen Reactions  . Sulfa Antibiotics Rash  . Metformin And Related     diarrhea    Family History  Problem Relation Age of Onset  . Diabetes Mother   . Cancer Father     lung  . Heart disease Father   . Diabetes Maternal Grandmother   . Heart disease Maternal Grandfather   . Heart disease Paternal Grandfather   . Diabetes Sister     died from DM complications  . Diabetes Sister     had ESRD, kidney failure, died from liver disease  . Cancer Paternal Uncle     lung  . Cancer Paternal Grandmother     ovarian    BP 134/80   Pulse 63   Wt 202 lb 12.8 oz (92 kg)  SpO2 98%   BMI 29.95 kg/m    Review of Systems denies weight loss, blurry vision, headache, chest pain, sob, n/v, muscle cramps, hypoglycemia, excessive diaphoresis, and easy bruising.  She has fatigue, cold intolerance, rhinorrhea, frequent urination, and insomnia.     Objective:   Physical Exam VS: see vs page GEN: no distress HEAD: head: no deformity eyes: no periorbital swelling, no proptosis external nose and ears are normal mouth: no lesion seen NECK: supple, thyroid is not enlarged CHEST WALL: no deformity LUNGS: clear to auscultation CV: reg rate and rhythm, no murmur ABD: abdomen is soft, nontender.  no hepatosplenomegaly.  not distended.  no hernia.  Old healed surgical scar (midline). MUSCULOSKELETAL: muscle bulk and strength are grossly normal.  no obvious joint swelling.  gait is normal and steady EXTEMITIES: no deformity.  no ulcer on the feet.  feet are of normal color and temp.  no edema PULSES: dorsalis pedis intact  bilat.  no carotid bruit NEURO:  cn 2-12 grossly intact.   readily moves all 4's.  sensation is intact to touch on the feet SKIN:  Normal texture and temperature.  No rash or suspicious lesion is visible.   NODES:  None palpable at the neck PSYCH: alert, well-oriented.  Does not appear anxious nor depressed.  Lab Results  Component Value Date   HGBA1C 7.9 (H) 11/18/2015   I have reviewed outside records, and summarized: Pt was noted to have elevated a1c, and referred here.  cbg's were reported by pt to be in the 100's, and a1c was obtained.       Assessment & Plan:  Insulin-requiring type 2 DM, with polyneuropathy, new to me.  She may be manageable on oral rx.  We discussed.   We'll try to transition off insulin if we can. Patient is advised the following: Patient Instructions  good diet and exercise significantly improve the control of your diabetes.  please let me know if you wish to be referred to a dietician.  high blood sugar is very risky to your health.  you should see an eye doctor and dentist every year.  It is very important to get all recommended vaccinations.  Controlling your blood pressure and cholesterol drastically reduces the damage diabetes does to your body.  Those who smoke should quit.  Please discuss these with your doctor.  check your blood sugar twice a day.  vary the time of day when you check, between before the 3 meals, and at bedtime.  also check if you have symptoms of your blood sugar being too high or too low.  please keep a record of the readings and bring it to your next appointment here (or you can bring the meter itself).  You can write it on any piece of paper.  please call us sooner if your blood sugar goes below 70, or if you have a lot of readings over 200. We will need to take this complex situation in stages. For now, please add "janumet."  I have sent a prescription to your pharmacy.  Please start with 1 pill each morning, and Please continue the same  pioglitizone, and: Decrease the insulin to 5 units twice a day, and: Please call us in a few days, to tell us how the blood sugar is doing.   Please come back for a follow-up appointment in 6 weeks.

## 2015-12-09 ENCOUNTER — Ambulatory Visit (INDEPENDENT_AMBULATORY_CARE_PROVIDER_SITE_OTHER): Payer: Commercial Managed Care - HMO | Admitting: Endocrinology

## 2015-12-09 ENCOUNTER — Encounter: Payer: Self-pay | Admitting: Endocrinology

## 2015-12-09 VITALS — BP 134/80 | HR 63 | Wt 202.8 lb

## 2015-12-09 DIAGNOSIS — E1165 Type 2 diabetes mellitus with hyperglycemia: Secondary | ICD-10-CM

## 2015-12-09 DIAGNOSIS — IMO0001 Reserved for inherently not codable concepts without codable children: Secondary | ICD-10-CM

## 2015-12-09 DIAGNOSIS — Z794 Long term (current) use of insulin: Secondary | ICD-10-CM | POA: Diagnosis not present

## 2015-12-09 LAB — HM DIABETES FOOT EXAM

## 2015-12-09 MED ORDER — INSULIN NPH (HUMAN) (ISOPHANE) 100 UNIT/ML ~~LOC~~ SUSP: 5.0000 [IU] | Freq: Two times a day (BID) | SUBCUTANEOUS | 5 refills | Status: DC

## 2015-12-09 MED ORDER — SITAGLIP PHOS-METFORMIN HCL ER 50-1000 MG PO TB24: 2.0000 | ORAL_TABLET | ORAL | 11 refills | Status: DC

## 2015-12-09 NOTE — Patient Instructions (Addendum)
good diet and exercise significantly improve the control of your diabetes.  please let me know if you wish to be referred to a dietician.  high blood sugar is very risky to your health.  you should see an eye doctor and dentist every year.  It is very important to get all recommended vaccinations.  Controlling your blood pressure and cholesterol drastically reduces the damage diabetes does to your body.  Those who smoke should quit.  Please discuss these with your doctor.  check your blood sugar twice a day.  vary the time of day when you check, between before the 3 meals, and at bedtime.  also check if you have symptoms of your blood sugar being too high or too low.  please keep a record of the readings and bring it to your next appointment here (or you can bring the meter itself).  You can write it on any piece of paper.  please call us sooner if your blood sugar goes below 70, or if you have a lot of readings over 200. We will need to take this complex situation in stages. For now, please add "janumet."  I have sent a prescription to your pharmacy.  Please start with 1 pill each morning, and Please continue the same pioglitizone, and: Decrease the insulin to 5 units twice a day, and: Please call us in a few days, to tell us how the blood sugar is doing.   Please come back for a follow-up appointment in 6 weeks.

## 2015-12-12 ENCOUNTER — Telehealth: Payer: Self-pay | Admitting: *Deleted

## 2015-12-12 ENCOUNTER — Telehealth: Payer: Self-pay | Admitting: Endocrinology

## 2015-12-12 NOTE — Telephone Encounter (Signed)
The medication that she is on has the patient running to the bathroom and sometimes she can't even make it to the bathroom. Wanted to see if she can cut back on the dosage or if there is something else that can be called into the pharmacy.  [facility] [address] - [place], [place] - [id number] [phone] (Phone) [phone] (Fax)

## 2015-12-12 NOTE — Telephone Encounter (Signed)
Please verify pt is referring to diarrhea, rather than urinary frequency.  Also, is this with 2 pill of janumet per day, or just 1?

## 2015-12-12 NOTE — Telephone Encounter (Signed)
See message and please advise, Thanks!  

## 2015-12-12 NOTE — Telephone Encounter (Signed)
Appt scheduled for 12/16/15 @9am 

## 2015-12-13 NOTE — Progress Notes (Deleted)
Pre visit review using our clinic review tool, if applicable. No additional management support is needed unless otherwise documented below in the visit note. 

## 2015-12-13 NOTE — Telephone Encounter (Signed)
I contacted the patient and she advised me she is experiencing diarrhea and she is taking 2 pills of janument daily. Please advise, Thanks!

## 2015-12-13 NOTE — Telephone Encounter (Signed)
Ok, try taking just 1 per day Please let us know next week, how the blood sugar is doing.

## 2015-12-13 NOTE — Progress Notes (Deleted)
Subjective:   Nicole Waters is a 70 y.o. female who presents for Medicare Annual (Subsequent) preventive examination.  Review of Systems:  No ROS.  Medicare Wellness Visit.    Sleep patterns: {SX; SLEEP PATTERNS:18802}.   Home Safety/Smoke Alarms:   Living environment; residence and Firearm Safety: {Rehab home environment / accessibility:30080}. Seat Belt Safety/Bike Helmet:   Counseling:   Eye Exam-  Dental-  Female:   Pap-       Mammo- Last 11/21/14: BI-RADS CATEGORY  3: Probably benign. F/u in 6 months per report. Dexa scan-  Last 10/16/14: Normal      CCS- Last 01/06/12: Pt report normal with f/u needed in 5 yrs.      Objective:     Vitals: There were no vitals taken for this visit.  There is no height or weight on file to calculate BMI.   Tobacco History  Smoking Status  . Never Smoker  Smokeless Tobacco  . Not on file     Counseling given: Not Answered   Past Medical History:  Diagnosis Date  . Diabetes mellitus without complication (HCC)   . Hyperlipidemia   . Hypertension    Past Surgical History:  Procedure Laterality Date  . CHOLECYSTECTOMY  1980  . leep surgery     Family History  Problem Relation Age of Onset  . Diabetes Mother   . Cancer Father     lung  . Heart disease Father   . Diabetes Maternal Grandmother   . Heart disease Maternal Grandfather   . Heart disease Paternal Grandfather   . Diabetes Sister     died from DM complications  . Diabetes Sister     had ESRD, kidney failure, died from liver disease  . Cancer Paternal Uncle     lung  . Cancer Paternal Grandmother     ovarian   History  Sexual Activity  . Sexual activity: Not on file    Outpatient Encounter Prescriptions as of 12/16/2015  Medication Sig  . ACCU-CHEK FASTCLIX LANCETS MISC   . ACCU-CHEK SMARTVIEW test strip Use to check blood sugar daily.  Marland Kitchen. aspirin 81 MG tablet Take 81 mg by mouth daily.  . fluticasone (FLONASE) 50 MCG/ACT nasal spray USE 2  SPRAYS IN EACH NOSTRIL EVERY DAY  . insulin NPH Human (NOVOLIN N RELION) 100 UNIT/ML injection Inject 0.05 mLs (5 Units total) into the skin 2 (two) times daily before a meal.  . lisinopril (PRINIVIL,ZESTRIL) 20 MG tablet Take 0.5 tablets (10 mg total) by mouth daily.  . metoprolol succinate (TOPROL-XL) 25 MG 24 hr tablet TAKE 1 TABLET TWICE DAILY  . mirabegron ER (MYRBETRIQ) 50 MG TB24 tablet Take 1 tablet (50 mg total) by mouth daily.  . pioglitazone (ACTOS) 30 MG tablet Take 1 tablet (30 mg total) by mouth daily.  . simvastatin (ZOCOR) 20 MG tablet TAKE 1 TABLET  EVERY EVENING  . SitaGLIPtin-MetFORMIN HCl (JANUMET XR) 50-1000 MG TB24 Take 2 tablets by mouth every morning.   No facility-administered encounter medications on file as of 12/16/2015.     Activities of Daily Living In your present state of health, do you have any difficulty performing the following activities: 11/18/2015 08/14/2015  Hearing? N Y  Vision? N N  Difficulty concentrating or making decisions? N Y  Walking or climbing stairs? N Y  Dressing or bathing? N N  Doing errands, shopping? N N  Some recent data might be hidden    Patient Care Team: Sandford CrazeMelissa O'Sullivan, NP as  PCP - General (Internal Medicine)    Assessment:    Physical assessment deferred to PCP.  Exercise Activities and Dietary recommendations   Diet (meal preparation, eat out, water intake, caffeinated beverages, dairy products, fruits and vegetables): {Desc; diets:16563} Breakfast: Lunch:  Dinner:      Goals    None     Fall Risk Fall Risk  11/18/2015 08/14/2015 10/16/2014 08/03/2014  Falls in the past year? No No No No  Risk for fall due to : - - Impaired balance/gait -  Risk for fall due to (comments): - - pt. reports that sometimes her knee will give out  -   Depression Screen PHQ 2/9 Scores 11/18/2015 08/14/2015 10/16/2014 10/16/2014  PHQ - 2 Score 0 4 1 2   PHQ- 9 Score - 15 - 9     Cognitive Function        Immunization History   Administered Date(s) Administered  . Influenza, High Dose Seasonal PF 10/16/2014, 09/30/2015  . Influenza,inj,Quad PF,36+ Mos 11/18/2015  . Pneumococcal Conjugate-13 10/16/2014  . Pneumococcal Polysaccharide-23 11/18/2015  . Td 10/16/2014  . Zoster 01/05/2013   Screening Tests Health Maintenance  Topic Date Due  . FOOT EXAM  11/16/1956  . OPHTHALMOLOGY EXAM  11/16/1956  . Hepatitis C Screening  05/13/2016 (Originally August 19, 1945)  . HEMOGLOBIN A1C  05/17/2016  . MAMMOGRAM  11/20/2016  . COLONOSCOPY  01/05/2022  . TETANUS/TDAP  10/15/2024  . INFLUENZA VACCINE  Completed  . DEXA SCAN  Completed  . ZOSTAVAX  Completed  . PNA vac Low Risk Adult  Completed      Plan:   *** During the course of the visit the patient was educated and counseled about the following appropriate screening and preventive services:   Vaccines to include Pneumoccal, Influenza, Hepatitis B, Td, Zostavax, HCV  Cardiovascular Disease  Colorectal cancer screening  Bone density screening  Diabetes screening  Glaucoma screening  Mammography/PAP  Nutrition counseling   Patient Instructions (the written plan) was given to the patient.   Nicole Waters, Nicole Waters, [place]  12/13/2015

## 2015-12-13 NOTE — Telephone Encounter (Signed)
I contacted the patient and advised of message. She voiced understanding and had no further questions.  

## 2015-12-16 ENCOUNTER — Encounter: Payer: Self-pay | Admitting: Family

## 2015-12-16 ENCOUNTER — Ambulatory Visit: Payer: Commercial Managed Care - HMO | Admitting: *Deleted

## 2015-12-16 ENCOUNTER — Ambulatory Visit (INDEPENDENT_AMBULATORY_CARE_PROVIDER_SITE_OTHER): Payer: Commercial Managed Care - HMO | Admitting: Family

## 2015-12-16 VITALS — BP 136/76 | HR 70 | Temp 97.7°F | Resp 16 | Ht 69.0 in | Wt 203.6 lb

## 2015-12-16 DIAGNOSIS — E1165 Type 2 diabetes mellitus with hyperglycemia: Secondary | ICD-10-CM

## 2015-12-16 DIAGNOSIS — Z794 Long term (current) use of insulin: Secondary | ICD-10-CM | POA: Diagnosis not present

## 2015-12-16 DIAGNOSIS — I10 Essential (primary) hypertension: Secondary | ICD-10-CM | POA: Diagnosis not present

## 2015-12-16 DIAGNOSIS — Z0000 Encounter for general adult medical examination without abnormal findings: Secondary | ICD-10-CM | POA: Diagnosis not present

## 2015-12-16 DIAGNOSIS — N3281 Overactive bladder: Secondary | ICD-10-CM | POA: Diagnosis not present

## 2015-12-16 DIAGNOSIS — IMO0001 Reserved for inherently not codable concepts without codable children: Secondary | ICD-10-CM

## 2015-12-16 NOTE — Progress Notes (Addendum)
Subjective:   Nicole Waters is a 70 y.o. female who presents for Medicare Annual (Subsequent) preventive examination.  Review of Systems:  No ROS.  Medicare Wellness Visit.  Cardiac Risk Factors include: advanced age (>3455men, 60>65 women);diabetes mellitus;dyslipidemia;hypertension;obesity (BMI >30kg/m2) Sleep patterns: Pt states she tries to get about 7 hrs sleep per night, but often wakes up and feels tired in the morning. Reports she occasionally takes over the counter sleep med that does help. Home Safety/Smoke Alarms: smoke detectors in place. Living environment; residence and Firearm Safety: Lives at home with grandson. No firearms. No stairs. Feels safe. Seat Belt Safety/Bike Helmet: Wears seatbelt.   Counseling:   Eye Exam- Pt reports she sees Dr.Ellison at [facility]. Wears glasses. Reports she will sched appt today for diabetic eye exam.  Dental- Uses affordable dentures only as needed.  Female:   Pap- Reports last pap 3-5 yrs ago and states her doctor said she would no longer need pap. Mammo- Last 11/21/14: BI-RADS CATEGORY  3: Probably benign. F/u in 6 months per report. Dexa scan-  Last 10/16/14: Normal      CCS- Last 01/06/12: Pt report normal with f/u needed in 5 yrs.     Objective:     Vitals: BP 136/76 (BP Location: Left Arm, Patient Position: Sitting, Cuff Size: Large)   Pulse 70   Temp 97.7 F (36.5 C) (Oral)   Resp 16   Ht 5\' 9"  (1.753 m)   Wt 203 lb 9.6 oz (92.4 kg)   SpO2 97%   BMI 30.07 kg/m   Body mass index is 30.07 kg/m.   Tobacco History  Smoking Status  . Former Smoker  Smokeless Tobacco  . Former Social workerUser     Counseling given: Not Answered   Past Medical History:  Diagnosis Date  . Diabetes mellitus without complication (HCC)   . Hyperlipidemia   . Hypertension    Past Surgical History:  Procedure Laterality Date  . CHOLECYSTECTOMY  1980  . leep surgery     Family History  Problem Relation Age of Onset  . Diabetes  Mother   . Cancer Father     lung  . Heart disease Father   . Diabetes Maternal Grandmother   . Heart disease Maternal Grandfather   . Heart disease Paternal Grandfather   . Diabetes Sister     died from DM complications  . Diabetes Sister     had ESRD, kidney failure, died from liver disease  . Cancer Paternal Uncle     lung  . Cancer Paternal Grandmother     ovarian   History  Sexual Activity  . Sexual activity: Yes    Outpatient Encounter Prescriptions as of 12/16/2015  Medication Sig  . ACCU-CHEK FASTCLIX LANCETS MISC   . ACCU-CHEK SMARTVIEW test strip Use to check blood sugar daily.  Marland Kitchen. aspirin 81 MG tablet Take 81 mg by mouth daily.  . fluticasone (FLONASE) 50 MCG/ACT nasal spray USE 2 SPRAYS IN EACH NOSTRIL EVERY DAY  . insulin NPH Human (NOVOLIN N RELION) 100 UNIT/ML injection Inject 0.05 mLs (5 Units total) into the skin 2 (two) times daily before a meal.  . lisinopril (PRINIVIL,ZESTRIL) 20 MG tablet Take 0.5 tablets (10 mg total) by mouth daily.  . metoprolol succinate (TOPROL-XL) 25 MG 24 hr tablet TAKE 1 TABLET TWICE DAILY  . mirabegron ER (MYRBETRIQ) 50 MG TB24 tablet Take 1 tablet (50 mg total) by mouth daily.  . pioglitazone (ACTOS) 30 MG tablet Take  1 tablet (30 mg total) by mouth daily.  . simvastatin (ZOCOR) 20 MG tablet TAKE 1 TABLET  EVERY EVENING  . SitaGLIPtin-MetFORMIN HCl (JANUMET XR) 50-1000 MG TB24 Take 2 tablets by mouth every morning. (Patient taking differently: Take 1 tablet by mouth every morning. )   No facility-administered encounter medications on file as of 12/16/2015.     Activities of Daily Living In your present state of health, do you have any difficulty performing the following activities: 12/16/2015 11/18/2015  Hearing? N N  Vision? N N  Difficulty concentrating or making decisions? N N  Walking or climbing stairs? Y N  Dressing or bathing? N N  Doing errands, shopping? N N  Preparing Food and eating ? N -  Using the Toilet? N -    In the past six months, have you accidently leaked urine? Y -  Do you have problems with loss of bowel control? N -  Managing your Medications? N -  Managing your Finances? N -  Housekeeping or managing your Housekeeping? N -  Some recent data might be hidden    Patient Care Team: Sandford Craze, NP as PCP - General (Internal Medicine) Romero Belling, MD as Consulting Physician (Endocrinology)    Assessment:    Physical assessment deferred to PCP.  Exercise Activities and Dietary recommendations Current Exercise Habits: The patient does not participate in regular exercise at present (busy lifestyle), Exercise limited by: None identified   Diet: 24 HOUR RECALL Breakfast: Sausage, eggs, toast, grits, coffee Lunch: none Dinner: Steak, potatoes, cabbage, biscuit Report she does not drink water because it makes her nauseated.   Goals    . Get off of insulin injections and control DM with oral meds only      Fall Risk Fall Risk  12/16/2015 11/18/2015 08/14/2015 10/16/2014 08/03/2014  Falls in the past year? No No No No No  Risk for fall due to : - - - Impaired balance/gait -  Risk for fall due to (comments): - - - pt. reports that sometimes her knee will give out  -   Depression Screen PHQ 2/9 Scores 12/16/2015 11/18/2015 08/14/2015 10/16/2014  PHQ - 2 Score 0 0 4 1  PHQ- 9 Score - - 15 -     Cognitive Function MMSE - Mini Mental State Exam 12/16/2015  Orientation to time 5  Orientation to Place 5  Registration 3  Attention/ Calculation 5  Recall 3  Language- name 2 objects 2  Language- repeat 1  Language- follow 3 step command 3  Language- read & follow direction 1  Write a sentence 1  Copy design 1  Total score 30        Immunization History  Administered Date(s) Administered  . Influenza, High Dose Seasonal PF 10/16/2014, 09/30/2015  . Influenza,inj,Quad PF,36+ Mos 11/18/2015  . Pneumococcal Conjugate-13 10/16/2014  . Pneumococcal Polysaccharide-23  11/18/2015  . Td 10/16/2014  . Zoster 01/05/2013   Screening Tests Health Maintenance  Topic Date Due  . FOOT EXAM  11/16/1956  . OPHTHALMOLOGY EXAM  11/16/1956  . Hepatitis C Screening  05/13/2016 (Originally 04-24-1945)  . HEMOGLOBIN A1C  05/17/2016  . MAMMOGRAM  11/20/2016  . COLONOSCOPY  01/05/2022  . TETANUS/TDAP  10/15/2024  . INFLUENZA VACCINE  Completed  . DEXA SCAN  Completed  . ZOSTAVAX  Completed  . PNA vac Low Risk Adult  Completed      Plan:  Follow up with Sandford Craze as scheduled. Increase water intake as tolerated. (Encouraged to  try sparkling). Continue to eat heart healthy diet (full of fruits, vegetables, whole grains, lean protein, water--limit salt, fat, and sugar intake) and increase physical activity as tolerated. Schedule diabetic eye exam with your eye doctor.  During the course of the visit the patient was educated and counseled about the following appropriate screening and preventive services:   Vaccines to include Pneumoccal, Influenza, Hepatitis B, Td, Zostavax, HCV  Cardiovascular Disease  Colorectal cancer screening  Bone density screening  Diabetes screening  Glaucoma screening  Mammography/PAP  Nutrition counseling   Patient Instructions (the written plan) was given to the patient.   Avon GullyBritt, Homar Weinkauf Angel, [place]RN  12/16/2015

## 2015-12-16 NOTE — Progress Notes (Signed)
Pre visit review using our clinic review tool, if applicable. No additional management support is needed unless otherwise documented below in the visit note. 

## 2015-12-16 NOTE — Progress Notes (Signed)
Subjective:    Patient ID: Nicole Waters, female    DOB: Mar 06, 1945, 70 y.o.   MRN: [id number]  HPI  Ms. Nicole Waters presents today for a follow up visit. She complete a MWV with the RN earlier this morning.   DM2- she is now following with Dr. Everardo AllEllison (endo) and reports that her sugars have been well controlled on the 1/2 tab of janumet (had diarrhea on the whole tab).  Lab Results  Component Value Date   HGBA1C 7.9 (H) 11/18/2015   HGBA1C 7.4 (H) 08/14/2015   HGBA1C 8.0 (H) 05/14/2015   Lab Results  Component Value Date   MICROALBUR <0.7 08/03/2014   LDLCALC 61 08/14/2015   CREATININE 0.82 11/18/2015   OAB- last visit myrbetriq was increased from 25mg  ot 50mg  and is helping her symptoms at this dose  HTN- last visit we decreased her lisinopril dose form 20mg  to 10mg  due to low bp reading.  BP Readings from Last 3 Encounters:  12/16/15 136/76  12/09/15 134/80  11/18/15 106/82     Review of Systems    see HPI  Past Medical History:  Diagnosis Date  . Diabetes mellitus without complication (HCC)   . Hyperlipidemia   . Hypertension      Social History   Social History  . Marital status: Widowed    Spouse name: N/A  . Number of children: N/A  . Years of education: N/A   Occupational History  . Not on file.   Social History Main Topics  . Smoking status: Former Games developermoker  . Smokeless tobacco: Former NeurosurgeonUser  . Alcohol use No  . Drug use: No  . Sexual activity: Yes   Other Topics Concern  . Not on file   Social History Narrative   Works part time in an in home daycare   Son- lives locally, he has 3 children   Daughter-lives locally- has 2 children   Lives alone   Completed [employer] for childcare       Past Surgical History:  Procedure Laterality Date  . CHOLECYSTECTOMY  1980  . leep surgery      Family History  Problem Relation Age of Onset  . Diabetes Mother   . Cancer Father     lung  . Heart disease Father   . Diabetes Maternal  Grandmother   . Heart disease Maternal Grandfather   . Heart disease Paternal Grandfather   . Diabetes Sister     died from DM complications  . Diabetes Sister     had ESRD, kidney failure, died from liver disease  . Cancer Paternal Uncle     lung  . Cancer Paternal Grandmother     ovarian    Allergies  Allergen Reactions  . Sulfa Antibiotics Rash  . Metformin And Related     diarrhea    Current Outpatient Prescriptions on File Prior to Visit  Medication Sig Dispense Refill  . ACCU-CHEK FASTCLIX LANCETS MISC     . ACCU-CHEK SMARTVIEW test strip Use to check blood sugar daily. 300 each 1  . aspirin 81 MG tablet Take 81 mg by mouth daily.    . fluticasone (FLONASE) 50 MCG/ACT nasal spray USE 2 SPRAYS IN EACH NOSTRIL EVERY DAY 48 g 1  . insulin NPH Human (NOVOLIN N RELION) 100 UNIT/ML injection Inject 0.05 mLs (5 Units total) into the skin 2 (two) times daily before a meal. 10 mL 5  . lisinopril (PRINIVIL,ZESTRIL) 20 MG tablet Take 0.5 tablets (10 mg total)  by mouth daily. 45 tablet 1  . metoprolol succinate (TOPROL-XL) 25 MG 24 hr tablet TAKE 1 TABLET TWICE DAILY 180 tablet 1  . mirabegron ER (MYRBETRIQ) 50 MG TB24 tablet Take 1 tablet (50 mg total) by mouth daily. 30 tablet 5  . pioglitazone (ACTOS) 30 MG tablet Take 1 tablet (30 mg total) by mouth daily. 30 tablet 5  . simvastatin (ZOCOR) 20 MG tablet TAKE 1 TABLET  EVERY EVENING 90 tablet 1  . SitaGLIPtin-MetFORMIN HCl (JANUMET XR) 50-1000 MG TB24 Take 2 tablets by mouth every morning. (Patient taking differently: Take 1 tablet by mouth every morning. ) 60 tablet 11   No current facility-administered medications on file prior to visit.     BP 136/76 (BP Location: Left Arm, Patient Position: Sitting, Cuff Size: Large)   Pulse 70   Temp 97.7 F (36.5 C) (Oral)   Resp 16   Ht 5\' 9"  (1.753 m)   Wt 203 lb 9.6 oz (92.4 kg)   SpO2 97%   BMI 30.07 kg/m    Objective:   Physical Exam  Constitutional: She is oriented to  person, place, and time. She appears well-developed and well-nourished.  HENT:  Head: Normocephalic and atraumatic.  Cardiovascular: Normal rate, regular rhythm and normal heart sounds.   No murmur heard. Pulmonary/Chest: Effort normal and breath sounds normal. No respiratory distress. She has no wheezes.  Neurological: She is alert and oriented to person, place, and time.  Skin: Skin is warm and dry.  Psychiatric: She has a normal mood and affect. Her behavior is normal. Judgment and thought content normal.          Assessment & Plan:

## 2015-12-16 NOTE — Progress Notes (Signed)
Noted and agree. 

## 2015-12-16 NOTE — Assessment & Plan Note (Signed)
Improved on myrbetriq 50mg . Continue same.

## 2015-12-16 NOTE — Patient Instructions (Signed)
Follow up with Nicole Waters as scheduled. Increase water intake as tolerated. (Encouraged to try sparkling). Continue to eat heart healthy diet (full of fruits, vegetables, whole grains, lean protein, water--limit salt, fat, and sugar intake) and increase physical activity as tolerated. Schedule diabetic eye exam with your eye doctor.

## 2015-12-16 NOTE — Assessment & Plan Note (Signed)
Stable, management per endocrinology.  

## 2015-12-16 NOTE — Assessment & Plan Note (Signed)
BP stable with adjustment in BP dose.  Continue same.

## 2016-01-06 ENCOUNTER — Other Ambulatory Visit: Payer: Self-pay | Admitting: Family

## 2016-01-07 NOTE — Telephone Encounter (Signed)
[facility]  [facility]. Current med list indicates pt is taking 30mg  once a day. [facility] 15mg  Rx on 11/05/15 and 12/08/15. The 30mg  Rx we sent on 08/14/15 was placed on hold on pt's profile and never filled. I advised them to fill the 30mg  Rx and notified pt.

## 2016-01-09 NOTE — Telephone Encounter (Signed)
Noted  

## 2016-01-20 DIAGNOSIS — E109 Type 1 diabetes mellitus without complications: Secondary | ICD-10-CM | POA: Diagnosis not present

## 2016-01-20 DIAGNOSIS — H2513 Age-related nuclear cataract, bilateral: Secondary | ICD-10-CM | POA: Diagnosis not present

## 2016-01-20 DIAGNOSIS — H524 Presbyopia: Secondary | ICD-10-CM | POA: Diagnosis not present

## 2016-01-20 DIAGNOSIS — Z0100 Encounter for examination of eyes and vision without abnormal findings: Secondary | ICD-10-CM | POA: Diagnosis not present

## 2016-01-20 LAB — HM DIABETES EYE EXAM

## 2016-01-25 NOTE — Progress Notes (Deleted)
   Subjective:    Patient ID: Nicole Waters, female    DOB: 07-08-1945, 71 y.o.   MRN: [id number]  HPI Pt returns for f/u of diabetes mellitus: DM type: Insulin-requiring type 2 Dx'ed: 1995 Complications: polyneuropathy Therapy: insulin since 2015 GDM: never DKA: never Severe hypoglycemia: never Pancreatitis: never Other: plan is to transition off insulin if possible. Interval history:     Review of Systems     Objective:   Physical Exam VITAL SIGNS:  See vs page GENERAL: no distress Pulses: dorsalis pedis intact bilat.   MSK: no deformity of the feet CV: no leg edema Skin:  no ulcer on the feet.  normal color and temp on the feet. Neuro: sensation is intact to touch on the feet        Assessment & Plan:

## 2016-01-27 ENCOUNTER — Ambulatory Visit: Payer: Commercial Managed Care - HMO | Admitting: Endocrinology

## 2016-01-28 ENCOUNTER — Telehealth: Payer: Self-pay | Admitting: Internal Medicine

## 2016-01-28 NOTE — Telephone Encounter (Signed)
No show

## 2016-02-03 ENCOUNTER — Telehealth: Payer: Self-pay | Admitting: Family

## 2016-02-03 MED ORDER — TOLTERODINE TARTRATE ER 4 MG PO CP24: 4.0000 mg | ORAL_CAPSULE | Freq: Every day | ORAL | 2 refills | Status: DC

## 2016-02-03 NOTE — Telephone Encounter (Signed)
rx sent for detrol LA.

## 2016-02-03 NOTE — Telephone Encounter (Signed)
Notified pt and she voices understanding. 

## 2016-02-03 NOTE — Telephone Encounter (Signed)
Patient called stating that mirabegron ER (MYRBETRIQ) 50 MG TB24 tablet Is too expensive for her to get. She states it is $240.00 for a 30 day supply. She would like to know if she could get a different medication. Please advise  Phone: [phone]

## 2016-02-06 LAB — HM DIABETES EYE EXAM

## 2016-02-20 ENCOUNTER — Other Ambulatory Visit: Payer: Self-pay | Admitting: Family

## 2016-03-04 ENCOUNTER — Other Ambulatory Visit: Payer: Self-pay | Admitting: Family

## 2016-03-16 ENCOUNTER — Ambulatory Visit: Payer: Commercial Managed Care - HMO | Admitting: Family

## 2016-03-23 ENCOUNTER — Encounter: Payer: Self-pay | Admitting: Family

## 2016-03-23 ENCOUNTER — Ambulatory Visit (INDEPENDENT_AMBULATORY_CARE_PROVIDER_SITE_OTHER): Payer: Medicare HMO | Admitting: Family

## 2016-03-23 ENCOUNTER — Telehealth: Payer: Self-pay | Admitting: *Deleted

## 2016-03-23 VITALS — BP 109/59 | HR 61 | Temp 97.9°F | Resp 16 | Ht 69.0 in | Wt 200.0 lb

## 2016-03-23 DIAGNOSIS — IMO0001 Reserved for inherently not codable concepts without codable children: Secondary | ICD-10-CM

## 2016-03-23 DIAGNOSIS — I10 Essential (primary) hypertension: Secondary | ICD-10-CM | POA: Diagnosis not present

## 2016-03-23 DIAGNOSIS — Z794 Long term (current) use of insulin: Secondary | ICD-10-CM | POA: Diagnosis not present

## 2016-03-23 DIAGNOSIS — N3281 Overactive bladder: Secondary | ICD-10-CM

## 2016-03-23 DIAGNOSIS — Z1231 Encounter for screening mammogram for malignant neoplasm of breast: Secondary | ICD-10-CM | POA: Diagnosis not present

## 2016-03-23 DIAGNOSIS — E1165 Type 2 diabetes mellitus with hyperglycemia: Secondary | ICD-10-CM | POA: Diagnosis not present

## 2016-03-23 DIAGNOSIS — Z1239 Encounter for other screening for malignant neoplasm of breast: Secondary | ICD-10-CM

## 2016-03-23 LAB — MICROALBUMIN / CREATININE URINE RATIO
CREATININE, URINE: 118 mg/dL (ref 20–320)
MICROALB UR: 0.3 mg/dL
Microalb Creat Ratio: 3 mcg/mg creat (ref <30)

## 2016-03-23 LAB — BASIC METABOLIC PANEL
BUN: 12 mg/dL (ref 6–23)
CHLORIDE: 103 meq/L (ref 96–112)
CO2: 29 meq/L (ref 19–32)
Calcium: 9.4 mg/dL (ref 8.4–10.5)
Creatinine, Ser: 0.85 mg/dL (ref 0.40–1.20)
GFR: 85.2 mL/min (ref >60.00)
Glucose, Bld: 121 mg/dL — ABNORMAL HIGH (ref 70–99)
Potassium: 4.2 mEq/L (ref 3.5–5.1)
Sodium: 139 mEq/L (ref 135–145)

## 2016-03-23 LAB — HEMOGLOBIN A1C: HEMOGLOBIN A1C: 7.1 % — AB (ref 4.6–6.5)

## 2016-03-23 MED ORDER — LISINOPRIL 5 MG PO TABS: 5.0000 mg | ORAL_TABLET | Freq: Every day | ORAL | 3 refills | Status: DC

## 2016-03-23 NOTE — Progress Notes (Signed)
Pre visit review using our clinic review tool, if applicable. No additional management support is needed unless otherwise documented below in the visit note. 

## 2016-03-23 NOTE — Assessment & Plan Note (Signed)
Appears slightly overtreated. Change lisinopril from 10mg  to to 5mg  once daily. Follow up in 1 month for nurse visit BP check.

## 2016-03-23 NOTE — Patient Instructions (Addendum)
Please complete lab work prior to leaving.  You will be contacted about your referral to Endocrinology.  Change your lisinopril to 5mg  once daily.

## 2016-03-23 NOTE — Progress Notes (Signed)
Subjective:    Patient ID: Nicole Waters, female    DOB: 1945/07/10, 71 y.o.   MRN: [id number]  HPI  Nicole Waters is a 71 yr old female who presents today for follow up.  1) Overactive Bladder- maintained on myrbetriq 50mg . Reports that she has nocturia x 4.  Does not have significant urinary frequency during the day.   2) HTN- Reports that her home BP readings are usually very good. Reports mild dizziness sometimes.  BP Readings from Last 3 Encounters:  03/23/16 (!) 109/59  12/16/15 136/76  12/09/15 134/80   3) DM2- following with endocrinology.  She had an appointment back in January which she no-showed. She is requesting referral to endo in [place].  Using NPH 5 units bid. Janumet once daily, actos once daily. She reports that her sugars at home 118-120 (fasting).  Lab Results  Component Value Date   HGBA1C 7.9 (H) 11/18/2015   HGBA1C 7.4 (H) 08/14/2015   HGBA1C 8.0 (H) 05/14/2015   Lab Results  Component Value Date   MICROALBUR <0.7 08/03/2014   LDLCALC 61 08/14/2015   CREATININE 0.82 11/18/2015    Review of Systems See HPI  Past Medical History:  Diagnosis Date  . Diabetes mellitus without complication (HCC)   . Hyperlipidemia   . Hypertension      Social History   Social History  . Marital status: Widowed    Spouse name: N/A  . Number of children: N/A  . Years of education: N/A   Occupational History  . Not on file.   Social History Main Topics  . Smoking status: Former Games developermoker  . Smokeless tobacco: Former NeurosurgeonUser  . Alcohol use No  . Drug use: No  . Sexual activity: Yes   Other Topics Concern  . Not on file   Social History Narrative   Works part time in an in home daycare   Son- lives locally, he has 3 children   Daughter-lives locally- has 2 children   Lives alone   Completed [employer] for childcare       Past Surgical History:  Procedure Laterality Date  . CHOLECYSTECTOMY  1980  . leep surgery      Family History  Problem  Relation Age of Onset  . Diabetes Mother   . Cancer Father     lung  . Heart disease Father   . Diabetes Maternal Grandmother   . Heart disease Maternal Grandfather   . Heart disease Paternal Grandfather   . Diabetes Sister     died from DM complications  . Diabetes Sister     had ESRD, kidney failure, died from liver disease  . Cancer Paternal Uncle     lung  . Cancer Paternal Grandmother     ovarian    Allergies  Allergen Reactions  . Sulfa Antibiotics Rash  . Metformin And Related     diarrhea    Current Outpatient Prescriptions on File Prior to Visit  Medication Sig Dispense Refill  . ACCU-CHEK FASTCLIX LANCETS MISC     . ACCU-CHEK SMARTVIEW test strip USE TO CHECK BLOOD SUGAR DAILY. 100 each 1  . aspirin 81 MG tablet Take 81 mg by mouth daily.    . fluticasone (FLONASE) 50 MCG/ACT nasal spray USE 2 SPRAYS IN EACH NOSTRIL EVERY DAY 48 g 1  . insulin NPH Human (NOVOLIN N RELION) 100 UNIT/ML injection Inject 0.05 mLs (5 Units total) into the skin 2 (two) times daily before a meal. 10 mL 5  .  lisinopril (PRINIVIL,ZESTRIL) 20 MG tablet TAKE 1/2 TABLET EVERY DAY 45 tablet 1  . metoprolol succinate (TOPROL-XL) 25 MG 24 hr tablet TAKE 1 TABLET TWICE DAILY 180 tablet 1  . pioglitazone (ACTOS) 30 MG tablet Take 1 tablet (30 mg total) by mouth daily. 30 tablet 5  . simvastatin (ZOCOR) 20 MG tablet TAKE 1 TABLET  EVERY EVENING 90 tablet 1  . SitaGLIPtin-MetFORMIN HCl (JANUMET XR) 50-1000 MG TB24 Take 2 tablets by mouth every morning. (Patient taking differently: Take 1 tablet by mouth every morning. ) 60 tablet 11  . tolterodine (DETROL LA) 4 MG 24 hr capsule Take 1 capsule (4 mg total) by mouth daily. 30 capsule 2   No current facility-administered medications on file prior to visit.     BP (!) 109/59 (BP Location: Left Arm, Cuff Size: Large)   Pulse 61   Temp 97.9 F (36.6 C) (Oral)   Resp 16   Ht 5\' 9"  (1.753 m)   Wt 200 lb (90.7 kg)   SpO2 100% Comment: ROOM AIR  BMI  29.53 kg/m       Objective:   Physical Exam  Constitutional: She is oriented to person, place, and time. She appears well-developed and well-nourished.  HENT:  Head: Normocephalic and atraumatic.  Cardiovascular: Normal rate, regular rhythm and normal heart sounds.   No murmur heard. Pulmonary/Chest: Effort normal and breath sounds normal. No respiratory distress. She has no wheezes.  Neurological: She is alert and oriented to person, place, and time.  Psychiatric: She has a normal mood and affect. Her behavior is normal. Judgment and thought content normal.          Assessment & Plan:

## 2016-03-23 NOTE — Telephone Encounter (Signed)
Pt was seen in the office today and stated she had diabetic eye exam earlier this year at [facility] and report was normal. Called [phone] to request copy of most recent eye exam. Nicole Waters will fax reports. Awaiting result.

## 2016-03-23 NOTE — Assessment & Plan Note (Signed)
Has some nocturia.  Will need to optimize sugars. We also discussed avoiding liquids after dinner at night.

## 2016-03-23 NOTE — Assessment & Plan Note (Signed)
She reports that she has trouble getting to [place] and would like an endocrinologist in [place]. Will refer to Dr. Katrinka BlazingSmith. Obtain a1c, microalbumin in the meantime.

## 2016-03-24 ENCOUNTER — Encounter: Payer: Self-pay | Admitting: Family

## 2016-03-27 NOTE — Telephone Encounter (Signed)
Report received and abstracted 

## 2016-04-22 ENCOUNTER — Ambulatory Visit: Payer: Medicare HMO | Admitting: Family

## 2016-04-22 DIAGNOSIS — I10 Essential (primary) hypertension: Secondary | ICD-10-CM

## 2016-04-22 NOTE — Progress Notes (Signed)
Noted and agree. 

## 2016-04-22 NOTE — Progress Notes (Signed)
Pre visit review using our clinic tool,if applicable. No additional management support is needed unless otherwise documented below in the visit note.   Patient in for BP check today per order from M. Brayton Caves, NP dated 03/23/16.  Patient ha not had BP medication today. Advised to always take medication when coming to the office for BP check.  BP today =128/71 P=61   Per Evonnie Dawes, NP patient to continue medications as ordered and return for follow up appointment in 3 months. Patient currently has follow up scheduled.

## 2016-04-28 ENCOUNTER — Other Ambulatory Visit: Payer: Self-pay | Admitting: Family

## 2016-05-08 ENCOUNTER — Other Ambulatory Visit: Payer: Self-pay | Admitting: Family

## 2016-05-28 ENCOUNTER — Ambulatory Visit (INDEPENDENT_AMBULATORY_CARE_PROVIDER_SITE_OTHER): Payer: Medicare HMO | Admitting: Medical

## 2016-05-28 VITALS — BP 130/60 | HR 65 | Temp 98.6°F | Ht 69.0 in | Wt 199.6 lb

## 2016-05-28 DIAGNOSIS — J40 Bronchitis, not specified as acute or chronic: Secondary | ICD-10-CM

## 2016-05-28 DIAGNOSIS — J0100 Acute maxillary sinusitis, unspecified: Secondary | ICD-10-CM

## 2016-05-28 MED ORDER — BENZONATATE 100 MG PO CAPS: 100.0000 mg | ORAL_CAPSULE | Freq: Three times a day (TID) | ORAL | 0 refills | Status: DC | PRN

## 2016-05-28 MED ORDER — AZITHROMYCIN 250 MG PO TABS: ORAL_TABLET | ORAL | 0 refills | Status: DC

## 2016-05-28 NOTE — Progress Notes (Signed)
Subjective:    Patient ID: Nicole Waters, female    DOB: Jul 20, 1945, 71 y.o.   MRN: [id number]  HPI  Pt in with some cough, runny nose, and nasal congested. Cough is productive. Green color to mucous. No fever, no chill but occasional sweats. Pt states breathing ok with no wheezing.   Some fatigue recently worse day before yesterday. Pt a1-c last time was 7.1.  Pt took some benadryl otc. And flonase. She thought symptoms initially allergies.  Pt had symptoms all together for 3 days.   No prior use of inhalers and no wheezing.  Review of Systems  Constitutional: Positive for diaphoresis and fatigue. Negative for chills and fever.       Sweating but she thinks weather related.  HENT: Positive for congestion, sinus pain and sinus pressure. Negative for facial swelling.   Respiratory: Positive for cough. Negative for apnea, shortness of breath and wheezing.   Cardiovascular: Negative for chest pain and palpitations.  Gastrointestinal: Negative for abdominal pain.  Musculoskeletal: Negative for back pain and myalgias.  Neurological: Negative for dizziness, weakness, numbness and headaches.  Hematological: Negative for adenopathy. Does not bruise/bleed easily.  Psychiatric/Behavioral: Negative for behavioral problems and confusion.    Past Medical History:  Diagnosis Date  . Diabetes mellitus without complication (HCC)   . Hyperlipidemia   . Hypertension      Social History   Social History  . Marital status: Widowed    Spouse name: N/A  . Number of children: N/A  . Years of education: N/A   Occupational History  . Not on file.   Social History Main Topics  . Smoking status: Former Games developer  . Smokeless tobacco: Former Neurosurgeon  . Alcohol use No  . Drug use: No  . Sexual activity: Yes   Other Topics Concern  . Not on file   Social History Narrative   Works part time in an in home daycare   Son- lives locally, he has 3 children   Daughter-lives locally- has 2  children   Lives alone   Completed [employer] for childcare       Past Surgical History:  Procedure Laterality Date  . CHOLECYSTECTOMY  1980  . leep surgery      Family History  Problem Relation Age of Onset  . Diabetes Mother   . Cancer Father        lung  . Heart disease Father   . Diabetes Maternal Grandmother   . Heart disease Maternal Grandfather   . Heart disease Paternal Grandfather   . Diabetes Sister        died from DM complications  . Diabetes Sister        had ESRD, kidney failure, died from liver disease  . Cancer Paternal Uncle        lung  . Cancer Paternal Grandmother        ovarian    Allergies  Allergen Reactions  . Sulfa Antibiotics Rash  . Metformin And Related     diarrhea    Current Outpatient Prescriptions on File Prior to Visit  Medication Sig Dispense Refill  . ACCU-CHEK FASTCLIX LANCETS MISC     . ACCU-CHEK SMARTVIEW test strip USE TO CHECK BLOOD SUGAR DAILY. 100 each 1  . aspirin 81 MG tablet Take 81 mg by mouth daily.    . fluticasone (FLONASE) 50 MCG/ACT nasal spray USE 2 SPRAYS IN EACH NOSTRIL EVERY DAY 48 g 1  . insulin NPH Human (NOVOLIN N RELION)  100 UNIT/ML injection Inject 0.05 mLs (5 Units total) into the skin 2 (two) times daily before a meal. 10 mL 5  . lisinopril (PRINIVIL,ZESTRIL) 5 MG tablet Take 1 tablet (5 mg total) by mouth daily. 30 tablet 3  . metoprolol succinate (TOPROL-XL) 25 MG 24 hr tablet TAKE 1 TABLET TWICE DAILY 180 tablet 1  . pioglitazone (ACTOS) 30 MG tablet Take 1 tablet (30 mg total) by mouth daily. 30 tablet 5  . simvastatin (ZOCOR) 20 MG tablet TAKE 1 TABLET  EVERY EVENING 90 tablet 1  . SitaGLIPtin-MetFORMIN HCl (JANUMET XR) 50-1000 MG TB24 Take 2 tablets by mouth every morning. (Patient taking differently: Take 1 tablet by mouth every morning. ) 60 tablet 11  . tolterodine (DETROL LA) 4 MG 24 hr capsule TAKE ONE CAPSULE BY MOUTH ONCE DAILY 30 capsule 2   No current facility-administered medications on file  prior to visit.     BP 130/60   Pulse 65   Temp 98.6 F (37 C) (Oral)   Ht 5\' 9"  (1.753 m)   Wt 199 lb 9.6 oz (90.5 kg)   SpO2 100%   BMI 29.48 kg/m       Objective:   Physical Exam  General  Mental Status - Alert. General Appearance - Well groomed. Not in acute distress.  Skin Rashes- No Rashes.  HEENT Head- Normal. Ear Auditory Canal - Left- Normal. Right - Normal.Tympanic Membrane- Left- Normal. Right- Normal. Eye Sclera/Conjunctiva- Left- Normal. Right- Normal. Nose & Sinuses Nasal Mucosa- Left-  Boggy and Congested. Right-  Boggy and  Congested.Bilateral rt side maxillary and rt side frontal sinus pressure. Mouth & Throat Lips: Upper Lip- Normal: no dryness, cracking, pallor, cyanosis, or vesicular eruption. Lower Lip-Normal: no dryness, cracking, pallor, cyanosis or vesicular eruption. Buccal Mucosa- Bilateral- No Aphthous ulcers. Oropharynx- No Discharge or Erythema. Tonsils: Characteristics- Bilateral- No Erythema or Congestion. Size/Enlargement- Bilateral- No enlargement. Discharge- bilateral-None.  Neck Neck- Supple. No Masses.   Chest and Lung Exam Auscultation: Breath Sounds:-Clear even and unlabored.  Cardiovascular Auscultation:Rythm- Regular, rate and rhythm. Murmurs & Other Heart Sounds:Ausculatation of the heart reveal- No Murmurs.  Lymphatic Head & Neck General Head & Neck Lymphatics: Bilateral: Description- No Localized lymphadenopathy.       Assessment & Plan:  You appear to have bronchitis and sinusitis. Rest hydrate and tylenol for fever. I am prescribing cough medicine benzonatate, and azithromycin antibiotic. For your nasal congestion continue flonase  You should gradually get better. If not then notify us and would recommend a chest xray.  Follow up in 7-10 days or as needed  Jac Romulus, Ramon DredgeEdward, [employer]F CorporationPA-C

## 2016-05-28 NOTE — Patient Instructions (Signed)
You appear to have bronchitis and sinusitis. Rest hydrate and tylenol for fever. I am prescribing cough medicine benzonatate, and azithromycin antibiotic. For your nasal congestion continue flonase  You should gradually get better. If not then notify us and would recommend a chest xray.  Follow up in 7-10 days or as needed

## 2016-05-28 NOTE — Progress Notes (Signed)
Patient complains of coughing x 3 days.

## 2016-06-08 ENCOUNTER — Other Ambulatory Visit: Payer: Self-pay | Admitting: Family

## 2016-06-10 ENCOUNTER — Encounter: Payer: Self-pay | Admitting: Family

## 2016-06-10 ENCOUNTER — Encounter: Payer: Self-pay | Admitting: Endocrinology

## 2016-06-10 ENCOUNTER — Ambulatory Visit (INDEPENDENT_AMBULATORY_CARE_PROVIDER_SITE_OTHER): Payer: Medicare HMO | Admitting: Endocrinology

## 2016-06-10 VITALS — BP 128/82 | HR 66 | Ht 69.0 in | Wt 198.0 lb

## 2016-06-10 DIAGNOSIS — Z794 Long term (current) use of insulin: Secondary | ICD-10-CM

## 2016-06-10 DIAGNOSIS — E1165 Type 2 diabetes mellitus with hyperglycemia: Secondary | ICD-10-CM

## 2016-06-10 DIAGNOSIS — IMO0001 Reserved for inherently not codable concepts without codable children: Secondary | ICD-10-CM

## 2016-06-10 LAB — POCT GLYCOSYLATED HEMOGLOBIN (HGB A1C): Hemoglobin A1C: 6.3

## 2016-06-10 MED ORDER — METFORMIN HCL ER 500 MG PO TB24: 500.0000 mg | ORAL_TABLET | Freq: Every day | ORAL | 3 refills | Status: DC

## 2016-06-10 MED ORDER — SITAGLIPTIN PHOSPHATE 100 MG PO TABS: 100.0000 mg | ORAL_TABLET | Freq: Every day | ORAL | 11 refills | Status: DC

## 2016-06-10 NOTE — Patient Instructions (Addendum)
check your blood sugar twice a day.  vary the time of day when you check, between before the 3 meals, and at bedtime.  also check if you have symptoms of your blood sugar being too high or too low.  please keep a record of the readings and bring it to your next appointment here (or you can bring the meter itself).  You can write it on any piece of paper.  please call us sooner if your blood sugar goes below 70, or if you have a lot of readings over 200. You can stay off the insulin, and: Change the janumet to 2 different prescriptions, and:  Please continue the same pioglitizone, and:  Please come back for a follow-up appointment in 3 months.

## 2016-06-10 NOTE — Progress Notes (Signed)
Subjective:    Patient ID: Nicole Waters, female    DOB: 09/16/1945, 71 y.o.   MRN: [id number]  HPI Pt returns for f/u of diabetes mellitus: DM type: 2 Dx'ed: 1995 Complications: polyneuropathy Therapy: insulin since 2015 (and 3 oral meds) GDM: never DKA: never Severe hypoglycemia: never Pancreatitis: never Pancreatic imaging:  Other: goal is to d/c insulin.  Interval history: she takes meds as rx'ed, except she stopped insulin 1 month ago.  no cbg record, but states cbg's vary from 50-180 (but no hypoglycemia since off insulin).  It is in general higher as the day goes on.   Past Medical History:  Diagnosis Date  . Diabetes mellitus without complication (HCC)   . Hyperlipidemia   . Hypertension     Past Surgical History:  Procedure Laterality Date  . CHOLECYSTECTOMY  1980  . leep surgery      Social History   Social History  . Marital status: Widowed    Spouse name: N/A  . Number of children: N/A  . Years of education: N/A   Occupational History  . Not on file.   Social History Main Topics  . Smoking status: Former Games developer  . Smokeless tobacco: Former Neurosurgeon  . Alcohol use No  . Drug use: No  . Sexual activity: Yes   Other Topics Concern  . Not on file   Social History Narrative   Works part time in an in home daycare   Son- lives locally, he has 3 children   Daughter-lives locally- has 2 children   Lives alone   Completed [employer] for childcare       Current Outpatient Prescriptions on File Prior to Visit  Medication Sig Dispense Refill  . ACCU-CHEK FASTCLIX LANCETS MISC     . ACCU-CHEK SMARTVIEW test strip USE TO CHECK BLOOD SUGAR DAILY. 100 each 1  . aspirin 81 MG tablet Take 81 mg by mouth daily.    . fluticasone (FLONASE) 50 MCG/ACT nasal spray USE 2 SPRAYS IN EACH NOSTRIL EVERY DAY 48 g 1  . lisinopril (PRINIVIL,ZESTRIL) 5 MG tablet TAKE 1 TABLET EVERY DAY 90 tablet 1  . metoprolol succinate (TOPROL-XL) 25 MG 24 hr tablet TAKE 1 TABLET TWICE  DAILY 180 tablet 1  . pioglitazone (ACTOS) 30 MG tablet Take 1 tablet (30 mg total) by mouth daily. 30 tablet 5  . simvastatin (ZOCOR) 20 MG tablet TAKE 1 TABLET  EVERY EVENING 90 tablet 1  . tolterodine (DETROL LA) 4 MG 24 hr capsule TAKE ONE CAPSULE BY MOUTH ONCE DAILY 30 capsule 2   No current facility-administered medications on file prior to visit.     Allergies  Allergen Reactions  . Sulfa Antibiotics Rash  . Metformin And Related     diarrhea    Family History  Problem Relation Age of Onset  . Diabetes Mother   . Cancer Father        lung  . Heart disease Father   . Diabetes Maternal Grandmother   . Heart disease Maternal Grandfather   . Heart disease Paternal Grandfather   . Diabetes Sister        died from DM complications  . Diabetes Sister        had ESRD, kidney failure, died from liver disease  . Cancer Paternal Uncle        lung  . Cancer Paternal Grandmother        ovarian    BP 128/82   Pulse 66   Ht  5\' 9"  (1.753 m)   Wt 198 lb (89.8 kg)   SpO2 96%   BMI 29.24 kg/m    Review of Systems Denies LOC.     Objective:   Physical Exam VITAL SIGNS:  See vs page GENERAL: no distress Pulses: dorsalis pedis intact bilat.   MSK: no deformity of the feet CV: no leg edema Skin:  no ulcer on the feet.  normal color and temp on the feet. Neuro: sensation is intact to touch on the feet.    A1c=6.3%    Assessment & Plan:  Type 2 DM: well-controlled off insulin Diarrhea: this continues to limit metformin dosage Hypoglycemia, due to insulin.  Resolved off insulin.    Patient Instructions  check your blood sugar twice a day.  vary the time of day when you check, between before the 3 meals, and at bedtime.  also check if you have symptoms of your blood sugar being too high or too low.  please keep a record of the readings and bring it to your next appointment here (or you can bring the meter itself).  You can write it on any piece of paper.  please call us  sooner if your blood sugar goes below 70, or if you have a lot of readings over 200. You can stay off the insulin, and: Change the janumet to 2 different prescriptions, and:  Please continue the same pioglitizone, and:  Please come back for a follow-up appointment in 3 months.

## 2016-06-11 ENCOUNTER — Other Ambulatory Visit: Payer: Self-pay

## 2016-06-11 MED ORDER — SITAGLIP PHOS-METFORMIN HCL ER 50-1000 MG PO TB24: ORAL_TABLET | ORAL | 4 refills | Status: DC

## 2016-06-22 ENCOUNTER — Ambulatory Visit: Payer: Medicare HMO | Admitting: Family

## 2016-07-15 ENCOUNTER — Telehealth: Payer: Self-pay | Admitting: Endocrinology

## 2016-07-15 MED ORDER — SITAGLIPTIN PHOSPHATE 100 MG PO TABS
100.0000 mg | ORAL_TABLET | Freq: Every day | ORAL | 11 refills | Status: DC
Start: 2016-07-15 — End: 2016-09-11

## 2016-07-15 NOTE — Telephone Encounter (Signed)
Do you take janumet, ot [place]uv[place] and metformin separately?

## 2016-07-15 NOTE — Telephone Encounter (Signed)
**  Remind patient they can make refill requests via MyChart**  Medication refill request (Name & Dosage): sitaGLIPtin (JANUVIA) 100 MG tablet [[id number]][[id number]]    SitaGLIPtin-MetFORMIN HCl (JANUMET XR) 50-1000 MG TB24 [[id number]][[id number]]    Preferred pharmacy (Name & Address): [facility] [place] [id number] [phone] (Phone) [phone] (Fax)       Other comments (if applicable):

## 2016-07-15 NOTE — Telephone Encounter (Signed)
Requested a call back from the patient to further discuss.  

## 2016-07-15 NOTE — Telephone Encounter (Addendum)
Patient called back and stated she had been confused earlier. At this time she is only taking the Januvia and metformin not Janumet. Rx for Januvia submitted patient did not need a refill of metformin at this time.

## 2016-07-16 ENCOUNTER — Other Ambulatory Visit: Payer: Self-pay | Admitting: Family

## 2016-07-20 ENCOUNTER — Encounter: Payer: Self-pay | Admitting: Family

## 2016-07-20 ENCOUNTER — Ambulatory Visit (INDEPENDENT_AMBULATORY_CARE_PROVIDER_SITE_OTHER): Payer: Medicare HMO | Admitting: Family

## 2016-07-20 VITALS — BP 113/70 | HR 60 | Temp 98.3°F | Resp 16 | Ht 69.0 in | Wt 199.8 lb

## 2016-07-20 DIAGNOSIS — E119 Type 2 diabetes mellitus without complications: Secondary | ICD-10-CM | POA: Diagnosis not present

## 2016-07-20 DIAGNOSIS — N3281 Overactive bladder: Secondary | ICD-10-CM | POA: Diagnosis not present

## 2016-07-20 DIAGNOSIS — I10 Essential (primary) hypertension: Secondary | ICD-10-CM | POA: Diagnosis not present

## 2016-07-20 LAB — BASIC METABOLIC PANEL
BUN: 14 mg/dL (ref 6–23)
CALCIUM: 9.4 mg/dL (ref 8.4–10.5)
CHLORIDE: 102 meq/L (ref 96–112)
CO2: 28 meq/L (ref 19–32)
CREATININE: 0.91 mg/dL (ref 0.40–1.20)
GFR: 78.67 mL/min (ref >60.00)
GLUCOSE: 130 mg/dL — AB (ref 70–99)
Potassium: 3.9 mEq/L (ref 3.5–5.1)
Sodium: 139 mEq/L (ref 135–145)

## 2016-07-20 NOTE — Patient Instructions (Signed)
Please complete lab work prior to leaving. Keep up the good work! 

## 2016-07-20 NOTE — Progress Notes (Signed)
Subjective:    Patient ID: Nicole Waters, female    DOB: 11-01-1945, 71 y.o.   MRN: [id number]  HPI  Nicole Waters is a 71 yr old female who presents today for follow up.  1) HTN-  Last visit her lisinopril dose was cut down from 10mg  to 5mg . Continues metoprolol.   BP Readings from Last 3 Encounters:  07/20/16 113/70  06/10/16 128/82  05/28/16 130/60   2) OAB-continues myrbetriq. Reports that her frequency has decreased.    3) DM2- maintained on januvia, metformin. She has not been contacted about endo referral.   Lab Results  Component Value Date   HGBA1C 6.3 06/10/2016   HGBA1C 7.1 (H) 03/23/2016   HGBA1C 7.9 (H) 11/18/2015   Lab Results  Component Value Date   MICROALBUR 0.3 03/23/2016   LDLCALC 61 08/14/2015   CREATININE 0.85 03/23/2016      Review of Systems See HPI  Past Medical History:  Diagnosis Date  . Diabetes mellitus without complication (HCC)   . Hyperlipidemia   . Hypertension      Social History   Social History  . Marital status: Widowed    Spouse name: N/A  . Number of children: N/A  . Years of education: N/A   Occupational History  . Not on file.   Social History Main Topics  . Smoking status: Former Games developermoker  . Smokeless tobacco: Former NeurosurgeonUser  . Alcohol use No  . Drug use: No  . Sexual activity: Yes   Other Topics Concern  . Not on file   Social History Narrative   Works part time in an in home daycare   Son- lives locally, he has 3 children   Daughter-lives locally- has 2 children   Lives alone   Completed [employer] for childcare       Past Surgical History:  Procedure Laterality Date  . CHOLECYSTECTOMY  1980  . leep surgery      Family History  Problem Relation Age of Onset  . Diabetes Mother   . Cancer Father        lung  . Heart disease Father   . Diabetes Maternal Grandmother   . Heart disease Maternal Grandfather   . Heart disease Paternal Grandfather   . Diabetes Sister        died from DM  complications  . Diabetes Sister        had ESRD, kidney failure, died from liver disease  . Cancer Paternal Uncle        lung  . Cancer Paternal Grandmother        ovarian    Allergies  Allergen Reactions  . Sulfa Antibiotics Rash  . Metformin And Related     diarrhea    Current Outpatient Prescriptions on File Prior to Visit  Medication Sig Dispense Refill  . ACCU-CHEK FASTCLIX LANCETS MISC     . ACCU-CHEK SMARTVIEW test strip USE TO CHECK BLOOD SUGAR DAILY. 100 each 1  . aspirin 81 MG tablet Take 81 mg by mouth daily.    . fluticasone (FLONASE) 50 MCG/ACT nasal spray USE 2 SPRAYS IN EACH NOSTRIL EVERY DAY 48 g 1  . lisinopril (PRINIVIL,ZESTRIL) 5 MG tablet TAKE 1 TABLET EVERY DAY 90 tablet 1  . metFORMIN (GLUCOPHAGE-XR) 500 MG 24 hr tablet Take 1 tablet (500 mg total) by mouth daily with breakfast. 90 tablet 3  . metoprolol succinate (TOPROL-XL) 25 MG 24 hr tablet TAKE 1 TABLET TWICE DAILY 180 tablet 1  .  pioglitazone (ACTOS) 30 MG tablet Take 1 tablet (30 mg total) by mouth daily. 30 tablet 5  . simvastatin (ZOCOR) 20 MG tablet TAKE 1 TABLET  EVERY EVENING 90 tablet 1  . sitaGLIPtin (JANUVIA) 100 MG tablet Take 1 tablet (100 mg total) by mouth daily. 30 tablet 11  . tolterodine (DETROL LA) 4 MG 24 hr capsule TAKE ONE CAPSULE BY MOUTH ONCE DAILY 30 capsule 2   No current facility-administered medications on file prior to visit.     BP 113/70 (BP Location: Right Arm, Cuff Size: Normal)   Pulse 60   Temp 98.3 F (36.8 C) (Oral)   Resp 16   Ht 5\' 9"  (1.753 m)   Wt 199 lb 12.8 oz (90.6 kg)   SpO2 100%   BMI 29.51 kg/m       Objective:   Physical Exam  Constitutional: She is oriented to person, place, and time. She appears well-developed and well-nourished.  HENT:  Head: Normocephalic and atraumatic.  Cardiovascular: Normal rate, regular rhythm and normal heart sounds.   No murmur heard. Pulmonary/Chest: Effort normal and breath sounds normal. No respiratory  distress. She has no wheezes.  Musculoskeletal: She exhibits no edema.  Neurological: She is alert and oriented to person, place, and time.  Psychiatric: She has a normal mood and affect. Her behavior is normal. Judgment and thought content normal.          Assessment & Plan:  HTN- BP stable on decreased dose of lisinopril. Continue along with metoprolol. Obtain follow up bmet.   DM2- A1C at goal. I told pt ok to hold off of endo referral, continue current meds.  OAB- improved on detrol LA, continue same.

## 2016-08-24 ENCOUNTER — Other Ambulatory Visit: Payer: Self-pay | Admitting: Family

## 2016-09-08 IMAGING — US US BREAST LTD UNI LEFT INC AXILLA
1 series · 6 of 6 positions shown · non-contrast
Comparison: Previous exams including recent screening mammogram
dated 11/09/2014.

CLINICAL DATA: Patient returns today to evaluate possible masses
within each breast.

EXAM:
DIGITAL DIAGNOSTIC BILATERAL MAMMOGRAM WITH 3D TOMOSYNTHESIS WITH
CAD
ULTRASOUND BILATERAL BREAST

[Series 1: us breast ltd uni left inc axilla · 0.07mm/px · 6 of 6 slices shown]
[im 1/6]
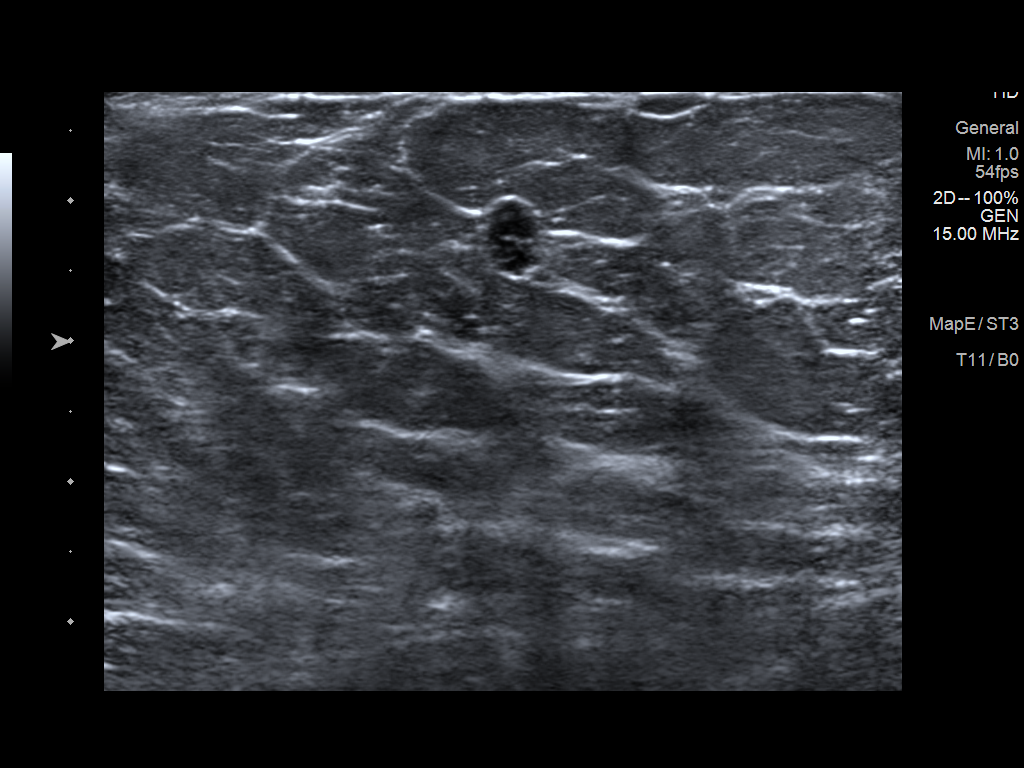
[im 2/6]
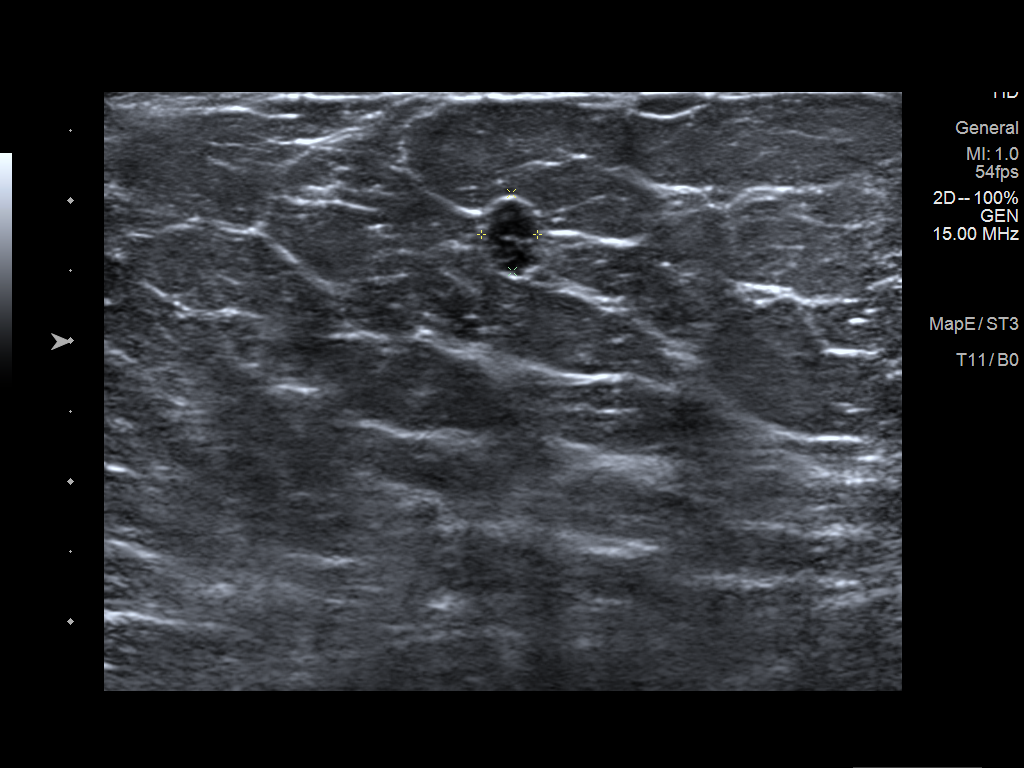
[im 3/6]
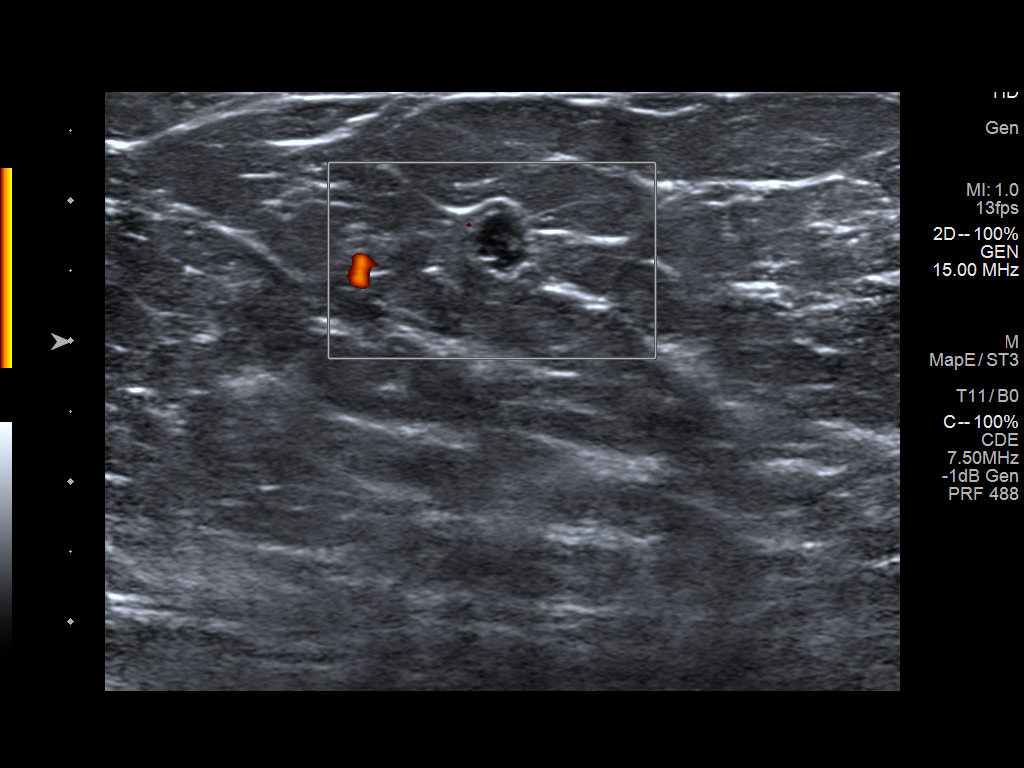
[im 4/6]
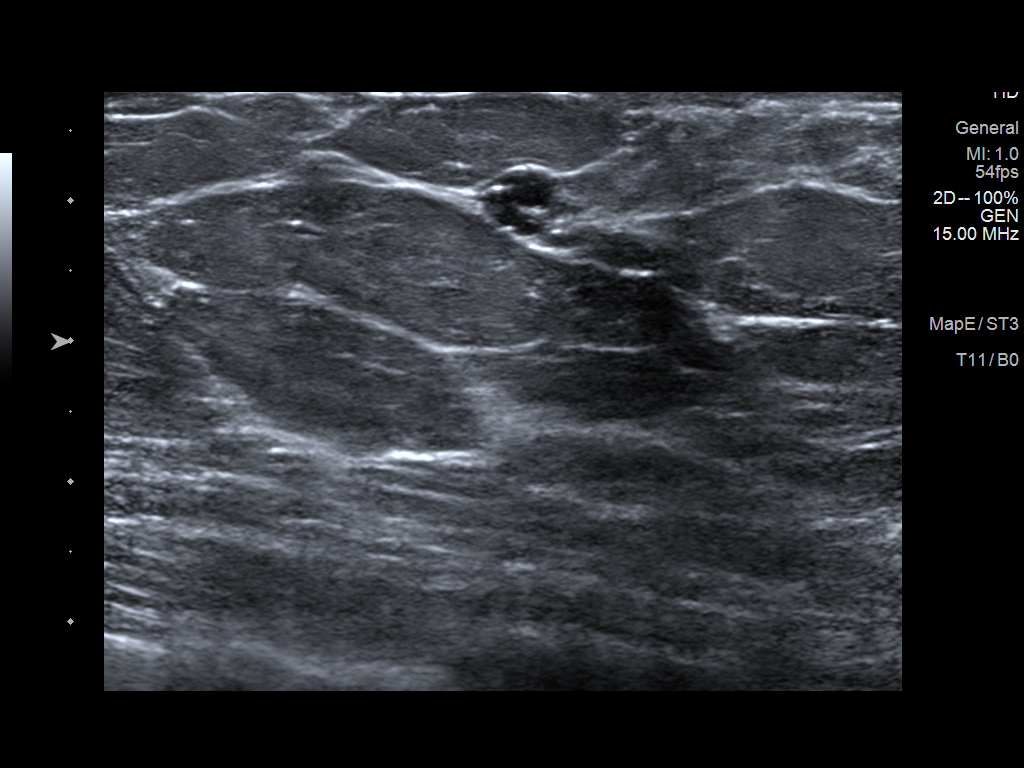
[im 5/6]
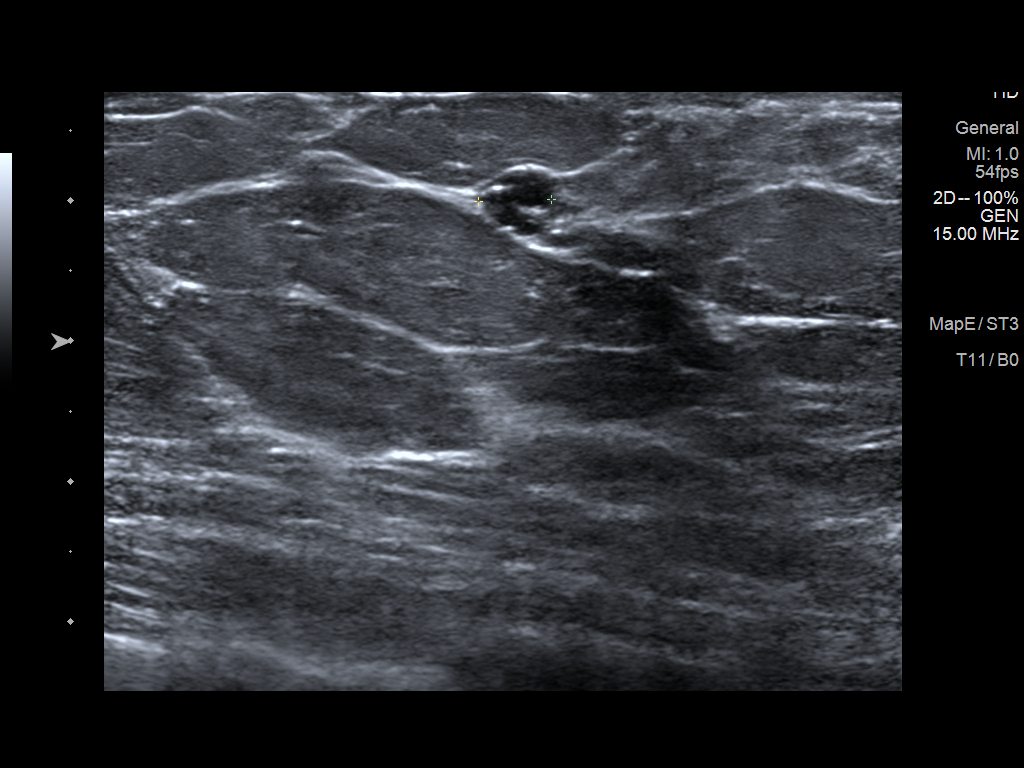
[im 6/6]
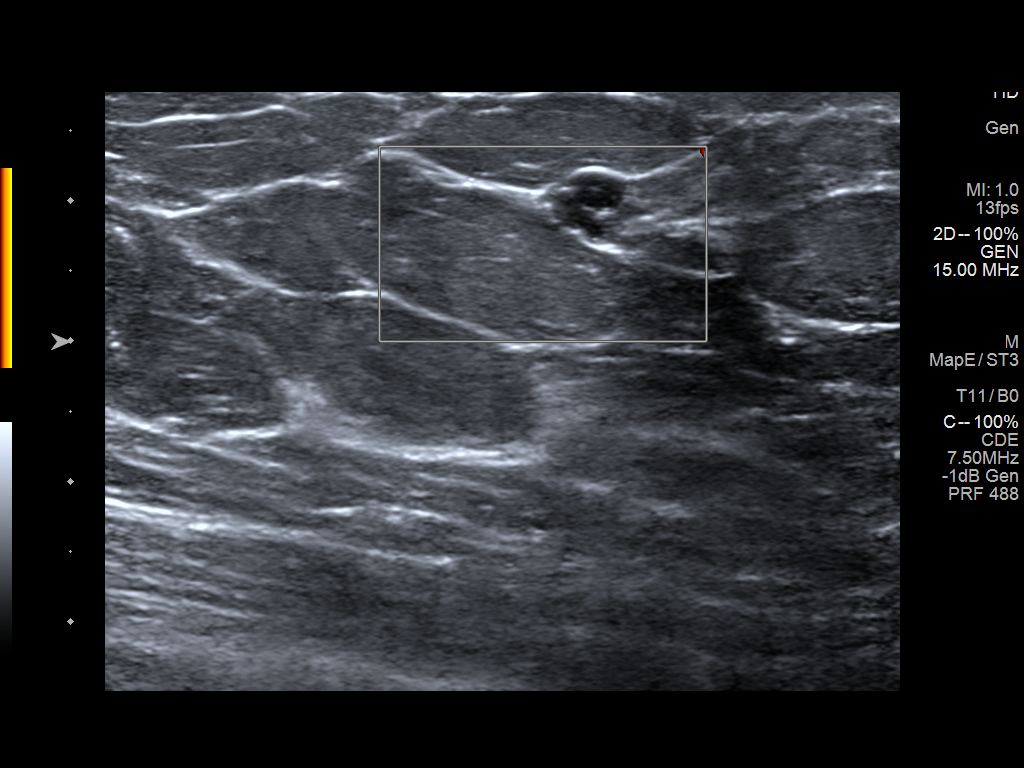

[6 of 6 positions shown; findings below may reference images not displayed]

ACR Breast Density Category b: There are scattered areas of
fibroglandular density.
FINDINGS: On today's additional views with 3D tomosynthesis, a questioned mass
within the outer right breast, 9-10 o'clock axis region, at middle
to posterior depth, has an appearance most suggestive of
superimposed normal fibroglandular tissues. An additional small oval
circumscribed mass within the retroareolar right breast is decreased
in size from the previous exams, compatible with benign etiology
(presumed benign cyst). There are no new dominant masses, suspicious
calcifications or secondary signs of malignancy identified in the
right breast.

Also on today's additional views with 3D tomosynthesis, a questioned
mass within the upper central left breast, at middle depth, has an
appearance most suggestive of superimposed normal fibroglandular
tissues. An oval circumscribed mass is seen within the left breast,
lower inner quadrant region, at anterior depth, measuring
approximately 6 mm greatest dimension (tomosynthesis CC slice 18).

Mammographic images were processed with CAD.

Targeted ultrasound of the BILATERAL breasts shows a probably benign
cluster of cysts within the LEFT breast at the 8 o'clock axis, 2 cm
from the nipple, measuring 6 x 4 x 5 mm, a probable correlate for
the mammographic finding. There is no sonographic correlate for the
questioned masses within the outer right breast and upper central
left breast, again most likely representing superimposed normal
fibroglandular tissues.
IMPRESSION: Probably benign cluster of cysts within the left breast at the 8
o'clock axis, 2 cm from the nipple, measuring 6 x 4 x 5 mm, a
probable correlate for the mammographic finding.

Probably benign superimposition of normal fibroglandular tissues
within the outer right breast and upper central left breast, without
sonographic correlate.

Recommend follow-up bilateral diagnostic mammogram, and possible
ultrasound, in 6 months to ensure stability.

RECOMMENDATION:
Follow-up bilateral diagnostic mammogram, and possible ultrasound,
in 6 months.

I have discussed the findings and recommendations with the patient.
Results were also provided in writing at the conclusion of the
visit. If applicable, a reminder letter will be sent to the patient
regarding the next appointment.

BI-RADS CATEGORY  3: Probably benign.

## 2016-09-10 ENCOUNTER — Ambulatory Visit: Payer: Medicare HMO | Admitting: Endocrinology

## 2016-09-11 ENCOUNTER — Ambulatory Visit (INDEPENDENT_AMBULATORY_CARE_PROVIDER_SITE_OTHER): Payer: Medicare HMO | Admitting: Endocrinology

## 2016-09-11 ENCOUNTER — Encounter: Payer: Self-pay | Admitting: Endocrinology

## 2016-09-11 VITALS — BP 138/84 | HR 56 | Wt 197.2 lb

## 2016-09-11 DIAGNOSIS — Z794 Long term (current) use of insulin: Secondary | ICD-10-CM | POA: Diagnosis not present

## 2016-09-11 DIAGNOSIS — E1165 Type 2 diabetes mellitus with hyperglycemia: Secondary | ICD-10-CM

## 2016-09-11 DIAGNOSIS — IMO0001 Reserved for inherently not codable concepts without codable children: Secondary | ICD-10-CM

## 2016-09-11 LAB — POCT GLYCOSYLATED HEMOGLOBIN (HGB A1C): HEMOGLOBIN A1C: 6

## 2016-09-11 MED ORDER — METOPROLOL SUCCINATE ER 25 MG PO TB24: 25.0000 mg | ORAL_TABLET | Freq: Every day | ORAL | 3 refills | Status: DC

## 2016-09-11 MED ORDER — SITAGLIPTIN PHOSPHATE 100 MG PO TABS: 50.0000 mg | ORAL_TABLET | Freq: Every day | ORAL | 11 refills | Status: DC

## 2016-09-11 NOTE — Patient Instructions (Addendum)
check your blood sugar twice a day.  vary the time of day when you check, between before the 3 meals, and at bedtime.  also check if you have symptoms of your blood sugar being too high or too low.  please keep a record of the readings and bring it to your next appointment here (or you can bring the meter itself).  You can write it on any piece of paper.  please call us sooner if your blood sugar goes below 70, or if you have a lot of readings over 200. Please make these 2 changes: Reduce the januvia to 1/2 pill per day, and; Reduce the metoprolol to 1 pill per day.  Please continue the same other medications. Please see Nicole Waters as scheduled next month, as scheduled.  She will want to recheck your blood pressure and heart rate Please come back for a follow-up appointment in 4-5 months.

## 2016-09-11 NOTE — Progress Notes (Signed)
Subjective:    Patient ID: Nicole Waters, female    DOB: 26-May-1945, 71 y.o.   MRN: [id number]  HPI Pt returns for f/u of diabetes mellitus: DM type: 2 Dx'ed: 1995 Complications: polyneuropathy Therapy: 3 oral meds GDM: never DKA: never Severe hypoglycemia: never Pancreatitis: never Pancreatic imaging: never Other: he took insulin 2015-2018) Interval history: pt states she feels well in general, except for slight lightheadedness.  She denies hypoglycemia.  Past Medical History:  Diagnosis Date  . Diabetes mellitus without complication (HCC)   . Hyperlipidemia   . Hypertension     Past Surgical History:  Procedure Laterality Date  . CHOLECYSTECTOMY  1980  . leep surgery      Social History   Social History  . Marital status: Widowed    Spouse name: N/A  . Number of children: N/A  . Years of education: N/A   Occupational History  . Not on file.   Social History Main Topics  . Smoking status: Former Games developer  . Smokeless tobacco: Former Neurosurgeon  . Alcohol use No  . Drug use: No  . Sexual activity: Yes   Other Topics Concern  . Not on file   Social History Narrative   Works part time in an in home daycare   Son- lives locally, he has 3 children   Daughter-lives locally- has 2 children   Lives alone   Completed [employer] for childcare       Current Outpatient Prescriptions on File Prior to Visit  Medication Sig Dispense Refill  . ACCU-CHEK FASTCLIX LANCETS MISC     . ACCU-CHEK SMARTVIEW test strip USE TO CHECK BLOOD SUGAR DAILY. 100 each 1  . aspirin 81 MG tablet Take 81 mg by mouth daily.    . fluticasone (FLONASE) 50 MCG/ACT nasal spray USE 2 SPRAYS IN EACH NOSTRIL EVERY DAY 48 g 1  . lisinopril (PRINIVIL,ZESTRIL) 5 MG tablet TAKE 1 TABLET EVERY DAY 90 tablet 1  . metFORMIN (GLUCOPHAGE-XR) 500 MG 24 hr tablet Take 1 tablet (500 mg total) by mouth daily with breakfast. 90 tablet 3  . pioglitazone (ACTOS) 30 MG tablet TAKE 1 TABLET EVERY DAY 90 tablet 3    . simvastatin (ZOCOR) 20 MG tablet TAKE 1 TABLET  EVERY EVENING 90 tablet 1  . tolterodine (DETROL LA) 4 MG 24 hr capsule TAKE ONE CAPSULE BY MOUTH ONCE DAILY 30 capsule 2   No current facility-administered medications on file prior to visit.     Allergies  Allergen Reactions  . Sulfa Antibiotics Rash  . Metformin And Related     diarrhea    Family History  Problem Relation Age of Onset  . Diabetes Mother   . Cancer Father        lung  . Heart disease Father   . Diabetes Maternal Grandmother   . Heart disease Maternal Grandfather   . Heart disease Paternal Grandfather   . Diabetes Sister        died from DM complications  . Diabetes Sister        had ESRD, kidney failure, died from liver disease  . Cancer Paternal Uncle        lung  . Cancer Paternal Grandmother        ovarian    BP 138/84   Pulse (!) 56   Wt 197 lb 3.2 oz (89.4 kg)   SpO2 97%   BMI 29.12 kg/m    Review of Systems Denies LOC    Objective:  Physical Exam VITAL SIGNS:  See vs page GENERAL: no distress Pulses: foot pulses are intact bilaterally.   MSK: no deformity of the feet or ankles.  CV: no edema of the legs or ankles Skin:  no ulcer on the feet or ankles.  normal color and temp on the feet and ankles Neuro: sensation is intact to touch on the feet and ankles.    Lab Results  Component Value Date   HGBA1C 6.0 09/11/2016       Assessment & Plan:  Dizziness: bradycardia may be a cause.  HTN: looks like she can tolerate reduction of the toprol.   Type 2 DM: overcontrolled, in view of dizziness.   Patient Instructions  check your blood sugar twice a day.  vary the time of day when you check, between before the 3 meals, and at bedtime.  also check if you have symptoms of your blood sugar being too high or too low.  please keep a record of the readings and bring it to your next appointment here (or you can bring the meter itself).  You can write it on any piece of paper.  please call us  sooner if your blood sugar goes below 70, or if you have a lot of readings over 200. Please make these 2 changes: Reduce the januvia to 1/2 pill per day, and; Reduce the metoprolol to 1 pill per day.  Please continue the same other medications. Please see Nicole Waters as scheduled next month, as scheduled.  She will want to recheck your blood pressure and heart rate Please come back for a follow-up appointment in 4-5 months.

## 2016-09-22 ENCOUNTER — Other Ambulatory Visit: Payer: Self-pay | Admitting: Family

## 2016-10-06 ENCOUNTER — Other Ambulatory Visit: Payer: Self-pay

## 2016-10-06 ENCOUNTER — Telehealth: Payer: Self-pay | Admitting: Endocrinology

## 2016-10-06 MED ORDER — SITAGLIPTIN PHOSPHATE 100 MG PO TABS: 50.0000 mg | ORAL_TABLET | Freq: Every day | ORAL | 11 refills | Status: DC

## 2016-10-06 NOTE — Telephone Encounter (Signed)
Patient cannot afford 90 day supply through mail order. She would like 30 day supply sent to  [facility] [address] [place], [place] - [address] [phone] (Phone) [phone] (Fax)   She believes it is for her : She said it was the medication that was decreased at her last visit. She also needs to clarify if she should still be taking metformin.   sitaGLIPtin (JANUVIA) 100 MG tablet

## 2016-10-06 NOTE — Telephone Encounter (Signed)
Yes, please take both.

## 2016-10-07 NOTE — Telephone Encounter (Signed)
Notified patient that she should be taking both prescriptions.

## 2016-10-12 ENCOUNTER — Other Ambulatory Visit: Payer: Self-pay | Admitting: Family

## 2016-10-13 MED ORDER — TOLTERODINE TARTRATE ER 4 MG PO CP24: 4.0000 mg | ORAL_CAPSULE | Freq: Every day | ORAL | 0 refills | Status: DC

## 2016-10-13 MED ORDER — TOLTERODINE TARTRATE ER 4 MG PO CP24: 4.0000 mg | ORAL_CAPSULE | Freq: Every day | ORAL | 1 refills | Status: DC

## 2016-10-13 NOTE — Telephone Encounter (Signed)
Rx approved and sent to the pharmacy by e-script.//AB/CMA 

## 2016-10-19 ENCOUNTER — Ambulatory Visit (INDEPENDENT_AMBULATORY_CARE_PROVIDER_SITE_OTHER): Payer: Medicare HMO | Admitting: Family

## 2016-10-19 ENCOUNTER — Encounter: Payer: Self-pay | Admitting: Family

## 2016-10-19 VITALS — BP 124/73 | HR 77 | Temp 98.3°F | Resp 16 | Ht 69.0 in | Wt 195.8 lb

## 2016-10-19 DIAGNOSIS — N3281 Overactive bladder: Secondary | ICD-10-CM

## 2016-10-19 DIAGNOSIS — E785 Hyperlipidemia, unspecified: Secondary | ICD-10-CM

## 2016-10-19 DIAGNOSIS — Z23 Encounter for immunization: Secondary | ICD-10-CM

## 2016-10-19 DIAGNOSIS — I10 Essential (primary) hypertension: Secondary | ICD-10-CM

## 2016-10-19 DIAGNOSIS — R400 Somnolence: Secondary | ICD-10-CM

## 2016-10-19 LAB — COMPREHENSIVE METABOLIC PANEL
ALK PHOS: 42 U/L (ref 39–117)
ALT: 11 U/L (ref 0–35)
AST: 13 U/L (ref 0–37)
Albumin: 3.8 g/dL (ref 3.5–5.2)
BILIRUBIN TOTAL: 0.4 mg/dL (ref 0.2–1.2)
BUN: 16 mg/dL (ref 6–23)
CALCIUM: 9.4 mg/dL (ref 8.4–10.5)
CO2: 28 mEq/L (ref 19–32)
Chloride: 101 mEq/L (ref 96–112)
Creatinine, Ser: 0.85 mg/dL (ref 0.40–1.20)
GFR: 85.05 mL/min (ref >60.00)
GLUCOSE: 130 mg/dL — AB (ref 70–99)
Potassium: 4.1 mEq/L (ref 3.5–5.1)
Sodium: 137 mEq/L (ref 135–145)
TOTAL PROTEIN: 7.6 g/dL (ref 6.0–8.3)

## 2016-10-19 LAB — LIPID PANEL
Cholesterol: 113 mg/dL (ref 0–200)
HDL: 36.5 mg/dL — AB (ref >39.00)
LDL Cholesterol: 56 mg/dL (ref 0–99)
NONHDL: 76.39
Total CHOL/HDL Ratio: 3
Triglycerides: 102 mg/dL (ref 0.0–149.0)
VLDL: 20.4 mg/dL (ref 0.0–40.0)

## 2016-10-19 NOTE — Patient Instructions (Addendum)
Please complete lab work prior to leaving.  You will be contacted about your referral for sleep study.

## 2016-10-19 NOTE — Assessment & Plan Note (Addendum)
Stable on detrol LA.  Continue same.

## 2016-10-19 NOTE — Assessment & Plan Note (Signed)
Tolerating statin, obtain follow-up lipid panel. 

## 2016-10-19 NOTE — Progress Notes (Signed)
Subjective:    Patient ID: Nicole Waters, female    DOB: 01/12/1945, 71 y.o.   MRN: [id number]  HPI   Patient is a 71 year old female who presents today for follow-up.  DM2- Pt is maintained on januvia and metformin.  Reports sugars are stable.  Rare hypoglycemia.    Lab Results  Component Value Date   HGBA1C 6.0 09/11/2016   HGBA1C 6.3 06/10/2016   HGBA1C 7.1 (H) 03/23/2016   Lab Results  Component Value Date   MICROALBUR 0.3 03/23/2016   LDLCALC 61 08/14/2015   CREATININE 0.91 07/20/2016   OAB- Maintained on detrol LA.  She reports that this helps her symptoms.  She still has nocturia x 3 but if she runs out her symptoms worsen.    HTN- maintained on lisinopril  and toprol xl.  BP Readings from Last 3 Encounters:  10/19/16 124/73  09/11/16 138/84  07/20/16 113/70   On simvastatin- denies myalgia.  Lab Results  Component Value Date   CHOL 124 08/14/2015   HDL 39.40 08/14/2015   LDLCALC 61 08/14/2015   TRIG 119.0 08/14/2015   CHOLHDL 3 08/14/2015   + daytime somnolence. + snoring, witnessed apneas.  Daughter has OSA.   Requesting sleep study.   Review of Systems See HPI  Past Medical History:  Diagnosis Date  . Diabetes mellitus without complication (HCC)   . Hyperlipidemia   . Hypertension      Social History   Social History  . Marital status: Widowed    Spouse name: N/A  . Number of children: N/A  . Years of education: N/A   Occupational History  . Not on file.   Social History Main Topics  . Smoking status: Former Games developer  . Smokeless tobacco: Former Neurosurgeon  . Alcohol use No  . Drug use: No  . Sexual activity: Yes   Other Topics Concern  . Not on file   Social History Narrative   Works part time in an in home daycare   Son- lives locally, he has 3 children   Daughter-lives locally- has 2 children   Lives alone   Completed [employer] for childcare       Past Surgical History:  Procedure Laterality Date  . CHOLECYSTECTOMY  1980    . leep surgery      Family History  Problem Relation Age of Onset  . Diabetes Mother   . Cancer Father        lung  . Heart disease Father   . Diabetes Maternal Grandmother   . Heart disease Maternal Grandfather   . Heart disease Paternal Grandfather   . Diabetes Sister        died from DM complications  . Diabetes Sister        had ESRD, kidney failure, died from liver disease  . Cancer Paternal Uncle        lung  . Cancer Paternal Grandmother        ovarian    Allergies  Allergen Reactions  . Sulfa Antibiotics Rash  . Metformin And Related     diarrhea    Current Outpatient Prescriptions on File Prior to Visit  Medication Sig Dispense Refill  . ACCU-CHEK FASTCLIX LANCETS MISC     . ACCU-CHEK SMARTVIEW test strip USE TO CHECK BLOOD SUGAR DAILY. 100 each 1  . aspirin 81 MG tablet Take 81 mg by mouth daily.    . fluticasone (FLONASE) 50 MCG/ACT nasal spray USE 2 SPRAYS IN EACH NOSTRIL EVERY  DAY 48 g 1  . lisinopril (PRINIVIL,ZESTRIL) 5 MG tablet TAKE 1 TABLET EVERY DAY 90 tablet 1  . metFORMIN (GLUCOPHAGE-XR) 500 MG 24 hr tablet Take 1 tablet (500 mg total) by mouth daily with breakfast. 90 tablet 3  . metoprolol succinate (TOPROL-XL) 25 MG 24 hr tablet Take 1 tablet (25 mg total) by mouth daily. 90 tablet 3  . pioglitazone (ACTOS) 30 MG tablet TAKE 1 TABLET EVERY DAY 90 tablet 3  . simvastatin (ZOCOR) 20 MG tablet TAKE 1 TABLET  EVERY EVENING 90 tablet 1  . sitaGLIPtin (JANUVIA) 100 MG tablet Take 0.5 tablets (50 mg total) by mouth daily. 15 tablet 11  . tolterodine (DETROL LA) 4 MG 24 hr capsule Take 1 capsule (4 mg total) by mouth daily. 90 capsule 0   No current facility-administered medications on file prior to visit.     BP 124/73   Temp 98.3 F (36.8 C) (Oral)   Ht  (1.753 m)   Wt 195 lb 12.8 oz (88.8 kg)   BMI 28.91 kg/m       Objective:   Physical Exam  Constitutional: She is oriented to person, place, and time. She appears well-developed and  well-nourished.  Cardiovascular: Normal rate, regular rhythm and normal heart sounds.   No murmur heard. Pulmonary/Chest: Effort normal and breath sounds normal. No respiratory distress. She has no wheezes.  Musculoskeletal: She exhibits no edema.  Neurological: She is alert and oriented to person, place, and time.  Psychiatric: She has a normal mood and affect. Her behavior is normal. Judgment and thought content normal.          Assessment & Plan:  Daytime somnolence- refer for home sleep study.

## 2016-10-19 NOTE — Assessment & Plan Note (Signed)
BP stable on low dose lisinopril .

## 2016-10-19 NOTE — Assessment & Plan Note (Signed)
Stable, management per endocrinology.  

## 2017-02-09 ENCOUNTER — Other Ambulatory Visit: Payer: Self-pay | Admitting: Family

## 2017-02-12 ENCOUNTER — Ambulatory Visit: Payer: Medicare HMO | Admitting: Endocrinology

## 2017-02-16 NOTE — Progress Notes (Deleted)
Subjective:   Nicole Waters is a 72 y.o. female who presents for Medicare Annual (Subsequent) preventive examination.  Review of Systems: No ROS.  Medicare Wellness Visit. Additional risk factors are reflected in the social history.    Sleep patterns:  Home Safety/Smoke Alarms: Feels safe in home. Smoke alarms in place.  Living environment; residence and Firearm Safety: . Seat Belt Safety/Bike Helmet: Wears seat belt.   Female:   Pap-       Mammo-       Dexa scan-        CCS- pt last reported 01/06/12-recall 5 yrs    Objective:     Vitals: There were no vitals taken for this visit.  There is no height or weight on file to calculate BMI.  Advanced Directives 12/16/2015 10/16/2014  Does Patient Have a Medical Advance Directive? No No  Does patient want to make changes to medical advance directive? Yes (MAU/Ambulatory/Procedural Areas - Information given) -  Would patient like information on creating a medical advance directive? - Yes - Transport planner given    Tobacco Social History   Tobacco Use  Smoking Status Former Smoker  Smokeless Tobacco Former Social worker given: Not Answered   Clinical Intake:                       Past Medical History:  Diagnosis Date  . Diabetes mellitus without complication (HCC)   . Hyperlipidemia   . Hypertension    Past Surgical History:  Procedure Laterality Date  . CHOLECYSTECTOMY  1980  . leep surgery     Family History  Problem Relation Age of Onset  . Diabetes Mother   . Cancer Father        lung  . Heart disease Father   . Diabetes Maternal Grandmother   . Heart disease Maternal Grandfather   . Heart disease Paternal Grandfather   . Diabetes Sister        died from DM complications  . Diabetes Sister        had ESRD, kidney failure, died from liver disease  . Cancer Paternal Uncle        lung  . Cancer Paternal Grandmother        ovarian   Social History   Socioeconomic  History  . Marital status: Widowed    Spouse name: Not on file  . Number of children: Not on file  . Years of education: Not on file  . Highest education level: Not on file  Social Needs  . Financial resource strain: Not on file  . Food insecurity - worry: Not on file  . Food insecurity - inability: Not on file  . Transportation needs - medical: Not on file  . Transportation needs - non-medical: Not on file  Occupational History  . Not on file  Tobacco Use  . Smoking status: Former Games developer  . Smokeless tobacco: Former Engineer, water and Sexual Activity  . Alcohol use: No    Alcohol/week: 0.0 oz  . Drug use: No  . Sexual activity: Yes  Other Topics Concern  . Not on file  Social History Narrative   Works part time in an in home daycare   Son- lives locally, he has 3 children   Daughter-lives locally- has 2 children   Lives alone   Completed [employer] for childcare    Outpatient Encounter Medications as of 02/22/2017  Medication Sig  . ACCU-CHEK FASTCLIX  LANCETS MISC   . ACCU-CHEK SMARTVIEW test strip USE TO CHECK BLOOD SUGAR DAILY.  Marland Kitchen aspirin 81 MG tablet Take 81 mg by mouth daily.  . fluticasone (FLONASE) 50 MCG/ACT nasal spray USE 2 SPRAYS IN EACH NOSTRIL EVERY DAY  . lisinopril (PRINIVIL,ZESTRIL) 5 MG tablet TAKE 1 TABLET EVERY DAY  . metFORMIN (GLUCOPHAGE-XR) 500 MG 24 hr tablet Take 1 tablet (500 mg total) by mouth daily with breakfast.  . metoprolol succinate (TOPROL-XL) 25 MG 24 hr tablet Take 1 tablet (25 mg total) by mouth daily.  . pioglitazone (ACTOS) 30 MG tablet TAKE 1 TABLET EVERY DAY  . simvastatin (ZOCOR) 20 MG tablet TAKE 1 TABLET  EVERY EVENING  . sitaGLIPtin (JANUVIA) 100 MG tablet Take 0.5 tablets (50 mg total) by mouth daily.  Marland Kitchen tolterodine (DETROL LA) 4 MG 24 hr capsule Take 1 capsule (4 mg total) by mouth daily.   No facility-administered encounter medications on file as of 02/22/2017.     Activities of Daily Living No flowsheet data found.  Patient  Care Team: Sandford Craze, NP as PCP - General (Internal Medicine) Romero Belling, MD as Consulting Physician (Endocrinology)    Assessment:   This is a routine wellness examination for Nicole Waters. Physical assessment deferred to PCP.  Exercise Activities and Dietary recommendations   Diet (meal preparation, eat out, water intake, caffeinated beverages, dairy products, fruits and vegetables): {Desc; diets:16563} Breakfast: Lunch:  Dinner:      Goals    None      Fall Risk Fall Risk  10/19/2016 12/16/2015 11/18/2015 08/14/2015 10/16/2014  Falls in the past year? No No No No No  Risk for fall due to : - - - - Impaired balance/gait  Risk for fall due to: Comment - - - - pt. reports that sometimes her knee will give out      Depression Screen PHQ 2/9 Scores 03/23/2016 12/16/2015 11/18/2015 08/14/2015  PHQ - 2 Score 1 0 0 4  PHQ- 9 Score 10 - - 15     Cognitive Function MMSE - Mini Mental State Exam 12/16/2015  Orientation to time 5  Orientation to Place 5  Registration 3  Attention/ Calculation 5  Recall 3  Language- name 2 objects 2  Language- repeat 1  Language- follow 3 step command 3  Language- read & follow direction 1  Write a sentence 1  Copy design 1  Total score 30        Immunization History  Administered Date(s) Administered  . Influenza, High Dose Seasonal PF 10/16/2014, 09/30/2015, 10/19/2016  . Influenza,inj,Quad PF,6+ Mos 11/18/2015  . Pneumococcal Conjugate-13 10/16/2014  . Pneumococcal Polysaccharide-23 11/18/2015  . Td 10/16/2014  . Tdap 04/08/2010  . Zoster 01/05/2013    Screening Tests Health Maintenance  Topic Date Due  . Hepatitis C Screening  [birth year]  . MAMMOGRAM  11/20/2016  . OPHTHALMOLOGY EXAM  02/05/2017  . HEMOGLOBIN A1C  03/11/2017  . FOOT EXAM  09/11/2017  . COLONOSCOPY  01/05/2022  . TETANUS/TDAP  10/15/2024  . INFLUENZA VACCINE  Completed  . DEXA SCAN  Completed  . PNA vac Low Risk Adult  Completed      Plan:    ***   I have personally reviewed and noted the following in the patient's chart:   . Medical and social history . Use of alcohol, tobacco or illicit drugs  . Current medications and supplements . Functional ability and status . Nutritional status . Physical activity . Advanced directives . List of  other physicians . Hospitalizations, surgeries, and ER visits in previous 12 months . Vitals . Screenings to include cognitive, depression, and falls . Referrals and appointments  In addition, I have reviewed and discussed with patient certain preventive protocols, quality metrics, and best practice recommendations. A written personalized care plan for preventive services as well as general preventive health recommendations were provided to patient.     Avon GullyBritt, Stephannie Broner Angel, [place]RN  02/16/2017

## 2017-02-22 ENCOUNTER — Ambulatory Visit: Payer: Medicare HMO | Admitting: *Deleted

## 2017-02-22 ENCOUNTER — Ambulatory Visit: Payer: Medicare HMO | Admitting: Family

## 2017-02-25 ENCOUNTER — Encounter: Payer: Self-pay | Admitting: *Deleted

## 2017-02-25 ENCOUNTER — Encounter: Payer: Self-pay | Admitting: Family

## 2017-02-25 ENCOUNTER — Ambulatory Visit (INDEPENDENT_AMBULATORY_CARE_PROVIDER_SITE_OTHER): Payer: Medicare HMO | Admitting: Family

## 2017-02-25 ENCOUNTER — Ambulatory Visit (INDEPENDENT_AMBULATORY_CARE_PROVIDER_SITE_OTHER): Payer: Medicare HMO | Admitting: *Deleted

## 2017-02-25 VITALS — BP 147/74 | HR 56 | Temp 98.0°F | Resp 16 | Ht 69.0 in | Wt 197.6 lb

## 2017-02-25 VITALS — BP 147/74 | HR 56 | Wt 196.0 lb

## 2017-02-25 DIAGNOSIS — Z0000 Encounter for general adult medical examination without abnormal findings: Secondary | ICD-10-CM

## 2017-02-25 DIAGNOSIS — N3281 Overactive bladder: Secondary | ICD-10-CM | POA: Diagnosis not present

## 2017-02-25 DIAGNOSIS — I10 Essential (primary) hypertension: Secondary | ICD-10-CM | POA: Diagnosis not present

## 2017-02-25 DIAGNOSIS — E119 Type 2 diabetes mellitus without complications: Secondary | ICD-10-CM | POA: Diagnosis not present

## 2017-02-25 MED ORDER — LISINOPRIL 5 MG PO TABS: 5.0000 mg | ORAL_TABLET | Freq: Every day | ORAL | 1 refills | Status: DC

## 2017-02-25 MED ORDER — OXYBUTYNIN CHLORIDE ER 5 MG PO TB24: 5.0000 mg | ORAL_TABLET | Freq: Every day | ORAL | 3 refills | Status: DC

## 2017-02-25 NOTE — Progress Notes (Signed)
Subjective:   Nicole Waters is a 72 y.o. female who presents for Medicare Annual (Subsequent) preventive examination.  Review of Systems:  No ROS.  Medicare Wellness Visit. Additional risk factors are reflected in the social history. Cardiac Risk Factors include: advanced age (>38men, >61 women);diabetes mellitus;dyslipidemia;family history of premature cardiovascular disease;hypertension;sedentary lifestyle Sleep patterns: Wakes often to urinate. Discussed with PCP. Sleeps 6-8 hrs.  Home Safety/Smoke Alarms: Feels safe in home. Smoke alarms in place.  Living environment; residence and Firearm Safety: No stairs. Lives alone.  Seat Belt Safety/Bike Helmet: Wears seat belt.   Female:   Pap- No longer doing routine screening due to age.       Mammo-  ordered     Dexa scan- last 2016.        CCS- last 01/06/12 per pt-w/ 5 yrs recall. Pt states she received letter and will schedule.     Objective:     Vitals: BP (!) 147/74 (BP Location: Right Arm, Patient Position: Sitting, Cuff Size: Normal)   Pulse (!) 56   Wt 196 lb (88.9 kg)   SpO2 100%   BMI 28.94 kg/m   Body mass index is 28.94 kg/m.  Advanced Directives 02/25/2017 12/16/2015 10/16/2014  Does Patient Have a Medical Advance Directive? No No No  Does patient want to make changes to medical advance directive? - Yes (MAU/Ambulatory/Procedural Areas - Information given) -  Would patient like information on creating a medical advance directive? Yes (MAU/Ambulatory/Procedural Areas - Information given) - Yes - Educational materials given    Tobacco Social History   Tobacco Use  Smoking Status Former Smoker  Smokeless Tobacco Former Neurosurgeon     Counseling given: Not Answered   Clinical Intake: No pain  Past Medical History:  Diagnosis Date  . Diabetes mellitus without complication (HCC)   . Hyperlipidemia   . Hypertension    Past Surgical History:  Procedure Laterality Date  . CHOLECYSTECTOMY  1980  . leep surgery      Family History  Problem Relation Age of Onset  . Diabetes Mother   . Cancer Father        lung  . Heart disease Father   . Diabetes Maternal Grandmother   . Heart disease Maternal Grandfather   . Heart disease Paternal Grandfather   . Diabetes Sister        died from DM complications  . Diabetes Sister        had ESRD, kidney failure, died from liver disease  . Cancer Paternal Uncle        lung  . Cancer Paternal Grandmother        ovarian   Social History   Socioeconomic History  . Marital status: Widowed    Spouse name: None  . Number of children: None  . Years of education: None  . Highest education level: None  Social Needs  . Financial resource strain: None  . Food insecurity - worry: None  . Food insecurity - inability: None  . Transportation needs - medical: None  . Transportation needs - non-medical: None  Occupational History  . None  Tobacco Use  . Smoking status: Former Games developer  . Smokeless tobacco: Former Engineer, water and Sexual Activity  . Alcohol use: No    Alcohol/week: 0.0 oz  . Drug use: No  . Sexual activity: Yes  Other Topics Concern  . None  Social History Narrative   Works part time in an in home daycare   Son- lives  locally, he has 3 children   Daughter-lives locally- has 2 children   Lives alone   Completed [employer] for childcare    Outpatient Encounter Medications as of 02/25/2017  Medication Sig  . ACCU-CHEK FASTCLIX LANCETS MISC   . ACCU-CHEK SMARTVIEW test strip USE TO CHECK BLOOD SUGAR DAILY.  Marland Kitchen. aspirin 81 MG tablet Take 81 mg by mouth daily.  . fluticasone (FLONASE) 50 MCG/ACT nasal spray USE 2 SPRAYS IN EACH NOSTRIL EVERY DAY  . lisinopril (PRINIVIL,ZESTRIL) 5 MG tablet Take 1 tablet (5 mg total) by mouth daily.  . metFORMIN (GLUCOPHAGE-XR) 500 MG 24 hr tablet Take 1 tablet (500 mg total) by mouth daily with breakfast.  . metoprolol succinate (TOPROL-XL) 25 MG 24 hr tablet Take 1 tablet (25 mg total) by mouth daily.  Marland Kitchen.  oxybutynin (DITROPAN-XL) 5 MG 24 hr tablet Take 1 tablet (5 mg total) by mouth at bedtime.  . pioglitazone (ACTOS) 30 MG tablet TAKE 1 TABLET EVERY DAY  . simvastatin (ZOCOR) 20 MG tablet TAKE 1 TABLET  EVERY EVENING  . sitaGLIPtin (JANUVIA) 100 MG tablet Take 0.5 tablets (50 mg total) by mouth daily.  . [DISCONTINUED] lisinopril (PRINIVIL,ZESTRIL) 5 MG tablet TAKE 1 TABLET EVERY DAY  . [DISCONTINUED] tolterodine (DETROL LA) 4 MG 24 hr capsule Take 1 capsule (4 mg total) by mouth daily. (Patient not taking: Reported on 02/25/2017)   No facility-administered encounter medications on file as of 02/25/2017.     Activities of Daily Living In your present state of health, do you have any difficulty performing the following activities: 02/25/2017 02/25/2017  Hearing? N N  Vision? N N  Difficulty concentrating or making decisions? Y Y  Comment - worsening with age  Walking or climbing stairs? Y Y  Comment - Difficulty climbing stairs with right knee pain  Dressing or bathing? N Y  Doing errands, shopping? N Y  Quarry managerreparing Food and eating ? N -  Using the Toilet? N -  In the past six months, have you accidently leaked urine? N -  Do you have problems with loss of bowel control? N -  Managing your Medications? N -  Managing your Finances? N -  Housekeeping or managing your Housekeeping? N -  Some recent data might be hidden    Patient Care Team: Nicole Waters'Sullivan, Melissa, NP as PCP - General (Internal Medicine) Nicole Waters, Sean, MD as Consulting Physician (Endocrinology)    Assessment:   This is a routine wellness examination for Nicole Waters. Physical assessment deferred to PCP.  Exercise Activities and Dietary recommendations Current Exercise Habits: The patient does not participate in regular exercise at present, Exercise limited by: None identified   Diet (meal preparation, eat out, water intake, caffeinated beverages, dairy products, fruits and vegetables): well balanced . Encouraged to drink  water.  Goals    . Increase physical activity     Pt states she is going to start going to the gym once per week.        Fall Risk Fall Risk  02/25/2017 10/19/2016 12/16/2015 11/18/2015 08/14/2015  Falls in the past year? No No No No No  Risk for fall due to : - - - - -  Risk for fall due to: Comment - - - - -    Depression Screen PHQ 2/9 Scores 02/25/2017 03/23/2016 12/16/2015 11/18/2015  PHQ - 2 Score 0 1 0 0  PHQ- 9 Score - 10 - -     Cognitive Function MMSE - Mini Mental State Exam 02/25/2017 12/16/2015  Orientation to time 5 5  Orientation to Place 5 5  Registration 3 3  Attention/ Calculation 5 5  Recall 3 3  Language- name 2 objects 2 2  Language- repeat 1 1  Language- follow 3 step command 3 3  Language- read & follow direction 1 1  Write a sentence 1 1  Copy design 1 1  Total score 30 30        Immunization History  Administered Date(s) Administered  . Influenza, High Dose Seasonal PF 10/16/2014, 09/30/2015, 10/19/2016  . Influenza,inj,Quad PF,6+ Mos 11/18/2015  . Pneumococcal Conjugate-13 10/16/2014  . Pneumococcal Polysaccharide-23 11/18/2015  . Td 10/16/2014  . Tdap 04/08/2010  . Zoster 01/05/2013   Screening Tests Health Maintenance  Topic Date Due  . Hepatitis C Screening  1945/09/08  . MAMMOGRAM  11/20/2016  . OPHTHALMOLOGY EXAM  02/05/2017  . HEMOGLOBIN A1C  03/11/2017  . FOOT EXAM  09/11/2017  . COLONOSCOPY  01/05/2022  . TETANUS/TDAP  10/15/2024  . INFLUENZA VACCINE  Completed  . DEXA SCAN  Completed  . PNA vac Low Risk Adult  Completed      Plan:   Follow up with PCP as directed  Continue to eat heart healthy diet (full of fruits, vegetables, whole grains, lean protein, water--limit salt, fat, and sugar intake) and increase physical activity as tolerated.  Continue doing brain stimulating activities (puzzles, reading, adult coloring books, staying active) to keep memory sharp.   Schedule mammogram   I have personally reviewed and  noted the following in the patient's chart:   . Medical and social history . Use of alcohol, tobacco or illicit drugs  . Current medications and supplements . Functional ability and status . Nutritional status . Physical activity . Advanced directives . List of other physicians . Hospitalizations, surgeries, and ER visits in previous 12 months . Vitals . Screenings to include cognitive, depression, and falls . Referrals and appointments  In addition, I have reviewed and discussed with patient certain preventive protocols, quality metrics, and best practice recommendations. A written personalized care plan for preventive services as well as general preventive health recommendations were provided to patient.     Mady Haagensen [place], [place]  02/25/2017

## 2017-02-25 NOTE — Progress Notes (Signed)
Subjective:    Patient ID: Nicole Waters, female    DOB: 09-02-1945, 72 y.o.   MRN: [id number]  HPI  Nicole Waters is a 72 yr old female who presents today for follow up.  1) HTN- maintained on metoprolol and lisinopril. Denies LE edema.  BP Readings from Last 3 Encounters:  02/25/17 (!) 147/74  10/19/16 124/73  09/11/16 138/84   2) OAB- could not afford Detrol.  Did not pick up. Continues to have urinary urgency.   3) DM2- being managed by endo. She has upcoming apt on 3/4 with Nicole Waters.   Lab Results  Component Value Date   HGBA1C 6.0 09/11/2016   HGBA1C 6.3 06/10/2016   HGBA1C 7.1 (H) 03/23/2016   Lab Results  Component Value Date   MICROALBUR 0.3 03/23/2016   LDLCALC 56 10/19/2016   CREATININE 0.85 10/19/2016      Review of Systems See HPI  Past Medical History:  Diagnosis Date  . Diabetes mellitus without complication (HCC)   . Hyperlipidemia   . Hypertension      Social History   Socioeconomic History  . Marital status: Widowed    Spouse name: Not on file  . Number of children: Not on file  . Years of education: Not on file  . Highest education level: Not on file  Social Needs  . Financial resource strain: Not on file  . Food insecurity - worry: Not on file  . Food insecurity - inability: Not on file  . Transportation needs - medical: Not on file  . Transportation needs - non-medical: Not on file  Occupational History  . Not on file  Tobacco Use  . Smoking status: Former Games developer  . Smokeless tobacco: Former Engineer, water and Sexual Activity  . Alcohol use: No    Alcohol/week: 0.0 oz  . Drug use: No  . Sexual activity: Yes  Other Topics Concern  . Not on file  Social History Narrative   Works part time in an in home daycare   Son- lives locally, he has 3 children   Daughter-lives locally- has 2 children   Lives alone   Completed [employer] for childcare    Past Surgical History:  Procedure Laterality Date  . CHOLECYSTECTOMY   1980  . leep surgery      Family History  Problem Relation Age of Onset  . Diabetes Mother   . Cancer Father        lung  . Heart disease Father   . Diabetes Maternal Grandmother   . Heart disease Maternal Grandfather   . Heart disease Paternal Grandfather   . Diabetes Sister        died from DM complications  . Diabetes Sister        had ESRD, kidney failure, died from liver disease  . Cancer Paternal Uncle        lung  . Cancer Paternal Grandmother        ovarian    Allergies  Allergen Reactions  . Sulfa Antibiotics Rash  . Metformin And Related     diarrhea    Current Outpatient Medications on File Prior to Visit  Medication Sig Dispense Refill  . ACCU-CHEK FASTCLIX LANCETS MISC     . ACCU-CHEK SMARTVIEW test strip USE TO CHECK BLOOD SUGAR DAILY. 100 each 1  . aspirin 81 MG tablet Take 81 mg by mouth daily.    . fluticasone (FLONASE) 50 MCG/ACT nasal spray USE 2 SPRAYS IN EACH NOSTRIL EVERY  DAY 48 g 1  . lisinopril (PRINIVIL,ZESTRIL) 5 MG tablet TAKE 1 TABLET EVERY DAY 90 tablet 1  . metFORMIN (GLUCOPHAGE-XR) 500 MG 24 hr tablet Take 1 tablet (500 mg total) by mouth daily with breakfast. 90 tablet 3  . metoprolol succinate (TOPROL-XL) 25 MG 24 hr tablet Take 1 tablet (25 mg total) by mouth daily. 90 tablet 3  . pioglitazone (ACTOS) 30 MG tablet TAKE 1 TABLET EVERY DAY 90 tablet 3  . simvastatin (ZOCOR) 20 MG tablet TAKE 1 TABLET  EVERY EVENING 90 tablet 1  . sitaGLIPtin (JANUVIA) 100 MG tablet Take 0.5 tablets (50 mg total) by mouth daily. 15 tablet 11   No current facility-administered medications on file prior to visit.     BP (!) 147/74 (BP Location: Right Arm, Patient Position: Sitting, Cuff Size: Large)   Pulse (!) 56   Temp 98 F (36.7 C) (Oral)   Resp 16   Ht 5\' 9"  (1.753 m)   Wt 197 lb 9.6 oz (89.6 kg)   SpO2 100%   BMI 29.18 kg/m       Objective:   Physical Exam  Constitutional: She is oriented to person, place, and time. She appears  well-developed and well-nourished.  Cardiovascular: Normal rate, regular rhythm and normal heart sounds.  No murmur heard. Pulmonary/Chest: Effort normal and breath sounds normal. No respiratory distress. She has no wheezes.  Musculoskeletal: She exhibits no edema.  Neurological: She is alert and oriented to person, place, and time.  Psychiatric: She has a normal mood and affect. Her behavior is normal. Judgment and thought content normal.          Assessment & Plan:  HTN- BP up a touch, acceptable for her age, continue current meds. Obtain follow up bmet.   OAB- trial of ditropan. She will let me know how her symptoms do on this and if it is helpful she will let me know so I can sent to her mail order.    DM2- clinically stable.  Management per endo.

## 2017-02-25 NOTE — Patient Instructions (Signed)
Follow up with PCP as directed  Continue to eat heart healthy diet (full of fruits, vegetables, whole grains, lean protein, water--limit salt, fat, and sugar intake) and increase physical activity as tolerated.  Continue doing brain stimulating activities (puzzles, reading, adult coloring books, staying active) to keep memory sharp.   Schedule mammogram   Nicole Waters , Thank you for taking time to come for your Medicare Wellness Visit. I appreciate your ongoing commitment to your health goals. Please review the following plan we discussed and let me know if I can assist you in the future.   These are the goals we discussed: Goals    . Increase physical activity     Pt states she is going to start going to the gym once per week.        This is a list of the screening recommended for you and due dates:  Health Maintenance  Topic Date Due  .  Hepatitis C: One time screening is recommended by Center for Disease Control  (CDC) for  adults born from [birth year] through 1964.   Apr 09, 1945  . Mammogram  11/20/2016  . Eye exam for diabetics  02/05/2017  . Hemoglobin A1C  03/11/2017  . Complete foot exam   09/11/2017  . Colon Cancer Screening  01/05/2022  . Tetanus Vaccine  10/15/2024  . Flu Shot  Completed  . DEXA scan (bone density measurement)  Completed  . Pneumonia vaccines  Completed    Health Maintenance for Postmenopausal Women Menopause is a normal process in which your reproductive ability comes to an end. This process happens gradually over a span of months to years, usually between the ages of 22 and 82. Menopause is complete when you have missed 12 consecutive menstrual periods. It is important to talk with your health care provider about some of the most common conditions that affect postmenopausal women, such as heart disease, cancer, and bone loss (osteoporosis). Adopting a healthy lifestyle and getting preventive care can help to promote your health and wellness. Those actions  can also lower your chances of developing some of these common conditions. What should I know about menopause? During menopause, you may experience a number of symptoms, such as:  Moderate-to-severe hot flashes.  Night sweats.  Decrease in sex drive.  Mood swings.  Headaches.  Tiredness.  Irritability.  Memory problems.  Insomnia.  Choosing to treat or not to treat menopausal changes is an individual decision that you make with your health care provider. What should I know about hormone replacement therapy and supplements? Hormone therapy products are effective for treating symptoms that are associated with menopause, such as hot flashes and night sweats. Hormone replacement carries certain risks, especially as you become older. If you are thinking about using estrogen or estrogen with progestin treatments, discuss the benefits and risks with your health care provider. What should I know about heart disease and stroke? Heart disease, heart attack, and stroke become more likely as you age. This may be due, in part, to the hormonal changes that your body experiences during menopause. These can affect how your body processes dietary fats, triglycerides, and cholesterol. Heart attack and stroke are both medical emergencies. There are many things that you can do to help prevent heart disease and stroke:  Have your blood pressure checked at least every 1-2 years. High blood pressure causes heart disease and increases the risk of stroke.  If you are 57-87 years old, ask your health care provider if you should take  aspirin to prevent a heart attack or a stroke.  Do not use any tobacco products, including cigarettes, chewing tobacco, or electronic cigarettes. If you need help quitting, ask your health care provider.  It is important to eat a healthy diet and maintain a healthy weight. ? Be sure to include plenty of vegetables, fruits, low-fat dairy products, and lean protein. ? Avoid  eating foods that are high in solid fats, added sugars, or salt (sodium).  Get regular exercise. This is one of the most important things that you can do for your health. ? Try to exercise for at least 150 minutes each week. The type of exercise that you do should increase your heart rate and make you sweat. This is known as moderate-intensity exercise. ? Try to do strengthening exercises at least twice each week. Do these in addition to the moderate-intensity exercise.  Know your numbers.Ask your health care provider to check your cholesterol and your blood glucose. Continue to have your blood tested as directed by your health care provider.  What should I know about cancer screening? There are several types of cancer. Take the following steps to reduce your risk and to catch any cancer development as early as possible. Breast Cancer  Practice breast self-awareness. ? This means understanding how your breasts normally appear and feel. ? It also means doing regular breast self-exams. Let your health care provider know about any changes, no matter how small.  If you are 74 or older, have a clinician do a breast exam (clinical breast exam or CBE) every year. Depending on your age, family history, and medical history, it may be recommended that you also have a yearly breast X-ray (mammogram).  If you have a family history of breast cancer, talk with your health care provider about genetic screening.  If you are at high risk for breast cancer, talk with your health care provider about having an MRI and a mammogram every year.  Breast cancer (BRCA) gene test is recommended for women who have family members with BRCA-related cancers. Results of the assessment will determine the need for genetic counseling and BRCA1 and for BRCA2 testing. BRCA-related cancers include these types: ? Breast. This occurs in males or females. ? Ovarian. ? Tubal. This may also be called fallopian tube cancer. ? Cancer  of the abdominal or pelvic lining (peritoneal cancer). ? Prostate. ? Pancreatic.  Cervical, Uterine, and Ovarian Cancer Your health care provider may recommend that you be screened regularly for cancer of the pelvic organs. These include your ovaries, uterus, and vagina. This screening involves a pelvic exam, which includes checking for microscopic changes to the surface of your cervix (Pap test).  For women ages 21-65, health care providers may recommend a pelvic exam and a Pap test every three years. For women ages 63-65, they may recommend the Pap test and pelvic exam, combined with testing for human papilloma virus (HPV), every five years. Some types of HPV increase your risk of cervical cancer. Testing for HPV may also be done on women of any age who have unclear Pap test results.  Other health care providers may not recommend any screening for nonpregnant women who are considered low risk for pelvic cancer and have no symptoms. Ask your health care provider if a screening pelvic exam is right for you.  If you have had past treatment for cervical cancer or a condition that could lead to cancer, you need Pap tests and screening for cancer for at least 20  years after your treatment. If Pap tests have been discontinued for you, your risk factors (such as having a new sexual partner) need to be reassessed to determine if you should start having screenings again. Some women have medical problems that increase the chance of getting cervical cancer. In these cases, your health care provider may recommend that you have screening and Pap tests more often.  If you have a family history of uterine cancer or ovarian cancer, talk with your health care provider about genetic screening.  If you have vaginal bleeding after reaching menopause, tell your health care provider.  There are currently no reliable tests available to screen for ovarian cancer.  Lung Cancer Lung cancer screening is recommended for  adults 61-13 years old who are at high risk for lung cancer because of a history of smoking. A yearly low-dose CT scan of the lungs is recommended if you:  Currently smoke.  Have a history of at least 30 pack-years of smoking and you currently smoke or have quit within the past 15 years. A pack-year is smoking an average of one pack of cigarettes per day for one year.  Yearly screening should:  Continue until it has been 15 years since you quit.  Stop if you develop a health problem that would prevent you from having lung cancer treatment.  Colorectal Cancer  This type of cancer can be detected and can often be prevented.  Routine colorectal cancer screening usually begins at age 37 and continues through age 25.  If you have risk factors for colon cancer, your health care provider may recommend that you be screened at an earlier age.  If you have a family history of colorectal cancer, talk with your health care provider about genetic screening.  Your health care provider may also recommend using home test kits to check for hidden blood in your stool.  A small camera at the end of a tube can be used to examine your colon directly (sigmoidoscopy or colonoscopy). This is done to check for the earliest forms of colorectal cancer.  Direct examination of the colon should be repeated every 5-10 years until age 43. However, if early forms of precancerous polyps or small growths are found or if you have a family history or genetic risk for colorectal cancer, you may need to be screened more often.  Skin Cancer  Check your skin from head to toe regularly.  Monitor any moles. Be sure to tell your health care provider: ? About any new moles or changes in moles, especially if there is a change in a mole's shape or color. ? If you have a mole that is larger than the size of a pencil eraser.  If any of your family members has a history of skin cancer, especially at a young age, talk with your  health care provider about genetic screening.  Always use sunscreen. Apply sunscreen liberally and repeatedly throughout the day.  Whenever you are outside, protect yourself by wearing long sleeves, pants, a wide-brimmed hat, and sunglasses.  What should I know about osteoporosis? Osteoporosis is a condition in which bone destruction happens more quickly than new bone creation. After menopause, you may be at an increased risk for osteoporosis. To help prevent osteoporosis or the bone fractures that can happen because of osteoporosis, the following is recommended:  If you are 69-73 years old, get at least 1,000 mg of calcium and at least 600 mg of vitamin D per day.  If you are  older than age 84 but younger than age 47, get at least 1,200 mg of calcium and at least 600 mg of vitamin D per day.  If you are older than age 73, get at least 1,200 mg of calcium and at least 800 mg of vitamin D per day.  Smoking and excessive alcohol intake increase the risk of osteoporosis. Eat foods that are rich in calcium and vitamin D, and do weight-bearing exercises several times each week as directed by your health care provider. What should I know about how menopause affects my mental health? Depression may occur at any age, but it is more common as you become older. Common symptoms of depression include:  Low or sad mood.  Changes in sleep patterns.  Changes in appetite or eating patterns.  Feeling an overall lack of motivation or enjoyment of activities that you previously enjoyed.  Frequent crying spells.  Talk with your health care provider if you think that you are experiencing depression. What should I know about immunizations? It is important that you get and maintain your immunizations. These include:  Tetanus, diphtheria, and pertussis (Tdap) booster vaccine.  Influenza every year before the flu season begins.  Pneumonia vaccine.  Shingles vaccine.  Your health care provider may  also recommend other immunizations. This information is not intended to replace advice given to you by your health care provider. Make sure you discuss any questions you have with your health care provider. Document Released: 02/13/2005 Document Revised: 07/12/2015 Document Reviewed: 09/25/2014 Elsevier Interactive Patient Education  2018 [employer].

## 2017-02-25 NOTE — Patient Instructions (Signed)
Please complete lab work prior to leaving.   

## 2017-02-26 ENCOUNTER — Encounter: Payer: Self-pay | Admitting: Family

## 2017-02-26 LAB — BASIC METABOLIC PANEL
BUN: 13 mg/dL (ref 6–23)
CALCIUM: 9.5 mg/dL (ref 8.4–10.5)
CHLORIDE: 103 meq/L (ref 96–112)
CO2: 28 meq/L (ref 19–32)
CREATININE: 0.78 mg/dL (ref 0.40–1.20)
GFR: 93.83 mL/min (ref >60.00)
Glucose, Bld: 85 mg/dL (ref 70–99)
Potassium: 4.3 mEq/L (ref 3.5–5.1)
Sodium: 139 mEq/L (ref 135–145)

## 2017-02-26 NOTE — Progress Notes (Signed)
Reviewed and agree.  Tavion Senkbeil S O'Sullivan NP 

## 2017-03-01 NOTE — Progress Notes (Signed)
Mailed out to pt 

## 2017-03-08 ENCOUNTER — Ambulatory Visit: Payer: Medicare HMO | Admitting: Endocrinology

## 2017-04-16 ENCOUNTER — Encounter: Payer: Self-pay | Admitting: Endocrinology

## 2017-04-16 ENCOUNTER — Ambulatory Visit: Payer: Medicare HMO | Admitting: Endocrinology

## 2017-04-16 VITALS — BP 124/82 | HR 56 | Wt 199.4 lb

## 2017-04-16 DIAGNOSIS — E1165 Type 2 diabetes mellitus with hyperglycemia: Secondary | ICD-10-CM | POA: Diagnosis not present

## 2017-04-16 DIAGNOSIS — Z794 Long term (current) use of insulin: Secondary | ICD-10-CM | POA: Diagnosis not present

## 2017-04-16 DIAGNOSIS — IMO0001 Reserved for inherently not codable concepts without codable children: Secondary | ICD-10-CM

## 2017-04-16 NOTE — Patient Instructions (Addendum)

## 2017-04-16 NOTE — Progress Notes (Signed)
Subjective:    Patient ID: Nicole Waters, female    DOB: 06-21-1945, 72 y.o.   MRN: [id number]  HPI Pt returns for f/u of diabetes mellitus: DM type: 2 Dx'ed: 1995 Complications: polyneuropathy Therapy: 3 oral meds GDM: never DKA: never Severe hypoglycemia: never.   Pancreatitis: never.  Pancreatic imaging: never.  Other: she took insulin 2015-2018.  Interval history: pt states she feels well in general, except for slight lightheadedness.  She denies hypoglycemia.   Past Medical History:  Diagnosis Date  . Diabetes mellitus without complication (HCC)   . Hyperlipidemia   . Hypertension     Past Surgical History:  Procedure Laterality Date  . CHOLECYSTECTOMY  1980  . leep surgery      Social History   Socioeconomic History  . Marital status: Widowed    Spouse name: Not on file  . Number of children: Not on file  . Years of education: Not on file  . Highest education level: Not on file  Occupational History  . Not on file  Social Needs  . Financial resource strain: Not on file  . Food insecurity:    Worry: Not on file    Inability: Not on file  . Transportation needs:    Medical: Not on file    Non-medical: Not on file  Tobacco Use  . Smoking status: Former Games developermoker  . Smokeless tobacco: Former Engineer, waterUser  Substance and Sexual Activity  . Alcohol use: No    Alcohol/week: 0.0 oz  . Drug use: No  . Sexual activity: Yes  Lifestyle  . Physical activity:    Days per week: Not on file    Minutes per session: Not on file  . Stress: Not on file  Relationships  . Social connections:    Talks on phone: Not on file    Gets together: Not on file    Attends religious service: Not on file    Active member of club or organization: Not on file    Attends meetings of clubs or organizations: Not on file    Relationship status: Not on file  . Intimate partner violence:    Fear of current or ex partner: Not on file    Emotionally abused: Not on file    Physically  abused: Not on file    Forced sexual activity: Not on file  Other Topics Concern  . Not on file  Social History Narrative   Works part time in an in home daycare   Son- lives locally, he has 3 children   Daughter-lives locally- has 2 children   Lives alone   Completed [employer] for childcare    Current Outpatient Medications on File Prior to Visit  Medication Sig Dispense Refill  . ACCU-CHEK FASTCLIX LANCETS MISC     . ACCU-CHEK SMARTVIEW test strip USE TO CHECK BLOOD SUGAR DAILY. 100 each 1  . aspirin 81 MG tablet Take 81 mg by mouth daily.    . fluticasone (FLONASE) 50 MCG/ACT nasal spray USE 2 SPRAYS IN EACH NOSTRIL EVERY DAY 48 g 1  . lisinopril (PRINIVIL,ZESTRIL) 5 MG tablet Take 1 tablet (5 mg total) by mouth daily. 90 tablet 1  . metFORMIN (GLUCOPHAGE-XR) 500 MG 24 hr tablet Take 1 tablet (500 mg total) by mouth daily with breakfast. 90 tablet 3  . metoprolol succinate (TOPROL-XL) 25 MG 24 hr tablet Take 1 tablet (25 mg total) by mouth daily. 90 tablet 3  . oxybutynin (DITROPAN-XL) 5 MG 24 hr tablet Take  1 tablet (5 mg total) by mouth at bedtime. 30 tablet 3  . pioglitazone (ACTOS) 30 MG tablet TAKE 1 TABLET EVERY DAY 90 tablet 3  . simvastatin (ZOCOR) 20 MG tablet TAKE 1 TABLET  EVERY EVENING 90 tablet 1  . sitaGLIPtin (JANUVIA) 100 MG tablet Take 0.5 tablets (50 mg total) by mouth daily. 15 tablet 11   No current facility-administered medications on file prior to visit.     Allergies  Allergen Reactions  . Sulfa Antibiotics Rash  . Metformin And Related     diarrhea    Family History  Problem Relation Age of Onset  . Diabetes Mother   . Cancer Father        lung  . Heart disease Father   . Diabetes Maternal Grandmother   . Heart disease Maternal Grandfather   . Heart disease Paternal Grandfather   . Diabetes Sister        died from DM complications  . Diabetes Sister        had ESRD, kidney failure, died from liver disease  . Cancer Paternal Uncle        lung  .  Cancer Paternal Grandmother        ovarian    BP 124/82 (BP Location: Left Arm, Patient Position: Sitting, Cuff Size: Normal)   Pulse (!) 56   Wt 199 lb 6.4 oz (90.4 kg)   SpO2 95%   BMI 29.45 kg/m    Review of Systems She denies hypoglycemia    Objective:   Physical Exam VITAL SIGNS:  See vs page GENERAL: no distress Pulses: foot pulses are intact bilaterally.   MSK: no deformity of the feet or ankles.  CV: no edema of the legs or ankles Skin:  no ulcer on the feet or ankles.  normal color and temp on the feet and ankles Neuro: sensation is intact to touch on the feet and ankles.    A1c=6.1%.      Assessment & Plan:  Type 2 DM, with polyneuropathy: well-controlled HTN: well-controlled  Patient Instructions  check your blood sugar once a day.  vary the time of day when you check, between before the 3 meals, and at bedtime.  also check if you have symptoms of your blood sugar being too high or too low.  please keep a record of the readings and bring it to your next appointment here (or you can bring the meter itself).  You can write it on any piece of paper.  please call us sooner if your blood sugar goes below 70, or if you have a lot of readings over 200. Please continue the same medications.   Please come back for a follow-up appointment in 6 months.

## 2017-05-14 DIAGNOSIS — H43812 Vitreous degeneration, left eye: Secondary | ICD-10-CM | POA: Diagnosis not present

## 2017-06-15 ENCOUNTER — Other Ambulatory Visit: Payer: Self-pay | Admitting: Endocrinology

## 2017-06-15 ENCOUNTER — Other Ambulatory Visit: Payer: Self-pay | Admitting: Family Medicine

## 2017-06-28 ENCOUNTER — Other Ambulatory Visit: Payer: Self-pay | Admitting: Family

## 2017-06-29 ENCOUNTER — Other Ambulatory Visit: Payer: Self-pay | Admitting: Endocrinology

## 2017-06-29 ENCOUNTER — Other Ambulatory Visit: Payer: Self-pay | Admitting: Family

## 2017-07-12 ENCOUNTER — Other Ambulatory Visit: Payer: Self-pay | Admitting: Family

## 2017-07-20 ENCOUNTER — Telehealth: Payer: Self-pay | Admitting: Family

## 2017-07-20 MED ORDER — OXYBUTYNIN CHLORIDE ER 5 MG PO TB24: 5.0000 mg | ORAL_TABLET | Freq: Every day | ORAL | 1 refills | Status: DC

## 2017-07-20 MED ORDER — LISINOPRIL 5 MG PO TABS: 5.0000 mg | ORAL_TABLET | Freq: Every day | ORAL | 1 refills | Status: DC

## 2017-07-20 MED ORDER — SIMVASTATIN 20 MG PO TABS: 20.0000 mg | ORAL_TABLET | Freq: Every evening | ORAL | 1 refills | Status: DC

## 2017-07-20 NOTE — Telephone Encounter (Signed)
Spoke with pt. She states she has no refills remaining on lisinopril and simvastatin. She does not need refills of fluticasone at this time. She is also requesting rx of oxybutynin to mail order instead of local pharmacy. Rxs sent.

## 2017-07-20 NOTE — Telephone Encounter (Signed)
Copied from CRM [phone]#[id number]. Topic: Quick Communication - See Telephone Encounter >> Jul 20, 2017 11:41 AM Burchel, Abbi R wrote: CRM for notification. See Telephone encounter for: 07/20/17.  Pt states that [facility] and some of her medication refills are being denied.  Please call pt to advise.    Pt:  [phone]

## 2017-08-09 ENCOUNTER — Ambulatory Visit (INDEPENDENT_AMBULATORY_CARE_PROVIDER_SITE_OTHER): Payer: Medicare HMO | Admitting: Family

## 2017-08-09 ENCOUNTER — Ambulatory Visit (HOSPITAL_BASED_OUTPATIENT_CLINIC_OR_DEPARTMENT_OTHER)
Admission: RE | Admit: 2017-08-09 | Discharge: 2017-08-09 | Disposition: A | Discharge status: Home / Self Care | Payer: Medicare HMO | Source: Ambulatory Visit | Attending: Family | Admitting: Family

## 2017-08-09 ENCOUNTER — Encounter: Payer: Self-pay | Admitting: Family

## 2017-08-09 VITALS — BP 140/67 | HR 69 | Temp 98.1°F | Resp 16 | Ht 69.0 in | Wt 197.0 lb

## 2017-08-09 DIAGNOSIS — M79605 Pain in left leg: Secondary | ICD-10-CM

## 2017-08-09 DIAGNOSIS — M79662 Pain in left lower leg: Secondary | ICD-10-CM | POA: Diagnosis not present

## 2017-08-09 DIAGNOSIS — M7989 Other specified soft tissue disorders: Secondary | ICD-10-CM | POA: Insufficient documentation

## 2017-08-09 DIAGNOSIS — M25562 Pain in left knee: Secondary | ICD-10-CM

## 2017-08-09 NOTE — Patient Instructions (Signed)
Please complete ultrasound of your leg on the first floor. For pain you may use tylenol 1000mg  twice daily. Call if pain worsens or if not improved in a few weeks.

## 2017-08-09 NOTE — Progress Notes (Signed)
Subjective:    Patient ID: Nicole Waters, female    DOB: [birth year], 72 y.o.   MRN: [id number]  HPI  Patient is a 72 yr old female who presents today with chief complaint of left sided knee pain.  Pain has been present x 2 weeks.  Reports that she took 2 steps down to her carport and had sudden pain in the left knee. Also reports pain in the left upper calf area.  Reprots that a long time ago she had fluid drawn from her left knee.  She has been using otc pain meds.  Not sure what she tood.    Review of Systems    see HPI  Past Medical History:  Diagnosis Date  . Diabetes mellitus without complication (HCC)   . Hyperlipidemia   . Hypertension      Social History   Socioeconomic History  . Marital status: Widowed    Spouse name: Not on file  . Number of children: Not on file  . Years of education: Not on file  . Highest education level: Not on file  Occupational History  . Not on file  Social Needs  . Financial resource strain: Not on file  . Food insecurity:    Worry: Not on file    Inability: Not on file  . Transportation needs:    Medical: Not on file    Non-medical: Not on file  Tobacco Use  . Smoking status: Former Games developermoker  . Smokeless tobacco: Former Engineer, waterUser  Substance and Sexual Activity  . Alcohol use: No    Alcohol/week: 0.0 oz  . Drug use: No  . Sexual activity: Yes  Lifestyle  . Physical activity:    Days per week: Not on file    Minutes per session: Not on file  . Stress: Not on file  Relationships  . Social connections:    Talks on phone: Not on file    Gets together: Not on file    Attends religious service: Not on file    Active member of club or organization: Not on file    Attends meetings of clubs or organizations: Not on file    Relationship status: Not on file  . Intimate partner violence:    Fear of current or ex partner: Not on file    Emotionally abused: Not on file    Physically abused: Not on file    Forced sexual activity: Not  on file  Other Topics Concern  . Not on file  Social History Narrative   Works part time in an in home daycare   Son- lives locally, he has 3 children   Daughter-lives locally- has 2 children   Lives alone   Completed [employer] for childcare    Past Surgical History:  Procedure Laterality Date  . CHOLECYSTECTOMY  1980  . leep surgery      Family History  Problem Relation Age of Onset  . Diabetes Mother   . Cancer Father        lung  . Heart disease Father   . Diabetes Maternal Grandmother   . Heart disease Maternal Grandfather   . Heart disease Paternal Grandfather   . Diabetes Sister        died from DM complications  . Diabetes Sister        had ESRD, kidney failure, died from liver disease  . Cancer Paternal Uncle        lung  . Cancer Paternal Grandmother  ovarian    Allergies  Allergen Reactions  . Sulfa Antibiotics Rash  . Metformin And Related     diarrhea    Current Outpatient Medications on File Prior to Visit  Medication Sig Dispense Refill  . ACCU-CHEK FASTCLIX LANCETS MISC     . ACCU-CHEK SMARTVIEW test strip USE TO CHECK BLOOD SUGAR DAILY. 100 each 1  . aspirin 81 MG tablet Take 81 mg by mouth daily.    . fluticasone (FLONASE) 50 MCG/ACT nasal spray USE 2 SPRAYS IN EACH NOSTRIL EVERY DAY 48 g 1  . JANUVIA 100 MG tablet TAKE 1 TABLET BY MOUTH ONCE DAILY 30 tablet 11  . lisinopril (PRINIVIL,ZESTRIL) 5 MG tablet Take 1 tablet (5 mg total) by mouth daily. 90 tablet 1  . metFORMIN (GLUCOPHAGE-XR) 500 MG 24 hr tablet TAKE 1 TABLET BY MOUTH ONCE DAILY WITH BREAKFAST 90 tablet 3  . metoprolol succinate (TOPROL-XL) 25 MG 24 hr tablet Take 1 tablet (25 mg total) by mouth daily. 90 tablet 3  . oxybutynin (DITROPAN-XL) 5 MG 24 hr tablet Take 1 tablet (5 mg total) by mouth at bedtime. 90 tablet 1  . pioglitazone (ACTOS) 30 MG tablet TAKE 1 TABLET EVERY DAY 90 tablet 3  . simvastatin (ZOCOR) 20 MG tablet Take 1 tablet (20 mg total) by mouth every evening. 90  tablet 1  . sitaGLIPtin (JANUVIA) 100 MG tablet Take 0.5 tablets (50 mg total) by mouth daily. 15 tablet 11   No current facility-administered medications on file prior to visit.     BP 140/67 (BP Location: Right Arm, Patient Position: Sitting, Cuff Size: Large)   Pulse 69   Temp 98.1 F (36.7 C) (Oral)   Resp 16   Ht 5\' 9"  (1.753 m)   Wt 197 lb (89.4 kg)   SpO2 98%   BMI 29.09 kg/m    Objective:   Physical Exam  Constitutional: She is oriented to person, place, and time. She appears well-developed and well-nourished.  Cardiovascular: Normal rate, regular rhythm and normal heart sounds.  No murmur heard. Pulmonary/Chest: Effort normal and breath sounds normal. No respiratory distress. She has no wheezes.  Musculoskeletal:  Mild swelling of left knee.  No warmth.  Some fullness left posterior knee  Neurological: She is alert and oriented to person, place, and time.  Skin: Skin is warm and dry.  Psychiatric: She has a normal mood and affect. Her behavior is normal. Judgment and thought content normal.          Assessment & Plan:  Left knee pain/left leg pain- lower extremity Dopplers performed to rule out blood clot.  Ultrasound is negative for clot.  Patient was advised on use of Tylenol thousand milligrams twice daily.  She is advised to call if symptoms worsen or if not improved in a few weeks and we will plan referral to orthopedics.

## 2017-08-23 ENCOUNTER — Telehealth: Payer: Self-pay

## 2017-08-23 NOTE — Telephone Encounter (Signed)
Copied from CRM [id number]. Topic: General - Other >> Aug 23, 2017 10:25 AM Leafy Roobinson, Norma J wrote: Reason for CRM:[facility]. Pt would like to know if melissa does cortisone injection. Pt is still having knee pain

## 2017-08-23 NOTE — Telephone Encounter (Signed)
Spoke with pt and advised her that [facility] does not do joint injections but we could schedule OV w/Dr Carmelia RollerWendling to see if joint injection is appropriate. Pt is agreeable and appt has been scheduled for Wednesday at 3:30pm.

## 2017-08-25 ENCOUNTER — Ambulatory Visit (INDEPENDENT_AMBULATORY_CARE_PROVIDER_SITE_OTHER): Payer: Medicare HMO | Admitting: Family Medicine

## 2017-08-25 ENCOUNTER — Encounter: Payer: Self-pay | Admitting: Family Medicine

## 2017-08-25 VITALS — BP 110/70 | HR 63 | Temp 98.4°F | Ht 69.0 in | Wt 201.1 lb

## 2017-08-25 DIAGNOSIS — S86812A Strain of other muscle(s) and tendon(s) at lower leg level, left leg, initial encounter: Secondary | ICD-10-CM | POA: Diagnosis not present

## 2017-08-25 MED ORDER — TRAMADOL HCL 50 MG PO TABS: 50.0000 mg | ORAL_TABLET | Freq: Three times a day (TID) | ORAL | 0 refills | Status: DC | PRN

## 2017-08-25 NOTE — Patient Instructions (Signed)
Ice/cold pack over area for 10-15 min twice daily.  Heat (pad or rice pillow in microwave) over affected area, 10-15 minutes twice daily.   Do not drink alcohol, do any illicit/street drugs, drive or do anything that requires alertness while on this medicine.   Let us know if you need anything.  Stretching and range of motion exercises These exercises warm up your muscles and joints and improve the movement and flexibility of your lower leg. These exercises also help to relieve pain and stiffness.  Exercise A: Gastrocnemius stretch 1. Sit with your left / right leg extended. 2. Loop a belt or towel around the ball of your left / right foot. The ball of your foot is on the walking surface, right under your toes. 3. Hold both ends of the belt or towel. 4. Keep your left / right ankle and foot relaxed and keep your knee straight while you use the belt or towel to pull your foot and ankle toward you. Stop at the first point of resistance. 5. Hold this position for 30 seconds. Repeat 2 times. Complete this exercise 3 times per week.  Exercise B: Ankle alphabet 1. Sit with your left / right leg supported at the lower leg. ? Do not rest your foot on anything. ? Make sure your foot has room to move freely. 2. Think of your left / right foot as a paintbrush, and move your foot to trace each letter of the alphabet in the air. Keep your hip and knee still while you trace. 3. Trace every letter from A to Z. Repeat 2 times. Complete this exercise 3 times per week.  Strengthening exercises These exercises build strength and endurance in your lower leg. Endurance is the ability to use your muscles for a long time, even after they get tired.  Exercise C: Plantar flexors with band 1. Sit with your left / right leg extended. 2. Loop a rubber exercise band or tube around the ball of your left / right foot. The ball of your foot is on the walking surface, right under your toes. 3. While holding both  ends of the band or tube, slowly point your toes downward, pushing them away from you. 4. Hold this position for 3 seconds. 5. Slowly return your foot to the starting position and repeat for a total of 10 repetitions. Repeat 2 times. Complete this exercise 3 times per week.  Exercise D: Plantar flexors, standing 1. Stand with your feet shoulder-width apart. 2. Place your hands on a wall or table to steady yourself as needed, but try not to use it very much for support. 3. Rise up on your toes. 4. If this exercise is too easy, try these options: ? Shift your weight toward your left / right leg until you feel challenged. ? If told by your health care provider, stand on your left / right foot only. 5. Hold this position for 3 seconds. 6. Repeat for a total of 10 repetitions. Repeat 2 times. Complete this exercise 3 times per week.  Exercise E: Plantar flexors, eccentric 1. Stand on the balls of your feet on the edge of a step. The ball of your foot is on the walking surface, right under your toes. 2. Place your hands on a wall or railing for balance as needed, but try not to lean on it for support. 3. Rise up on your toes, using both legs to help. 4. Slowly shift all of your weight to your left /  right foot and lift your other foot off the step. 5. Slowly lower your left / right heel so it drops below the level of the step. You will feel a slight stretch in your left / right calf. 6. Put your other foot back onto the step. Repeat 2 times. Complete this exercise 3 times per week. This information is not intended to replace advice given to you by your health care provider. Make sure you discuss any questions you have with your health care provider.

## 2017-08-25 NOTE — Progress Notes (Signed)
Musculoskeletal Exam  Patient: Nicole PontoLaverne Esh DOB: 1945/11/04  DOS: 08/25/2017  SUBJECTIVE:  Chief Complaint:   Chief Complaint  Patient presents with  . Knee Pain    left    Nicole Waters is a [age] y.o.  female for evaluation and treatment of L knee pain.   Onset: At least 15 yrs. Has injured getting out of car after stepping wrong.  Location: behind knee and down to calf Character:  aching and sharp  Progression of issue:  is unchanged Associated symptoms: difficulty walking Treatment: to date has been acetaminophen.   Neurovascular symptoms: no Neg Venous duplex 2 weeks ago.  ROS: Musculoskeletal/Extremities: +L knee/calf pain  Past Medical History:  Diagnosis Date  . Diabetes mellitus without complication (HCC)   . Hyperlipidemia   . Hypertension     Objective: VITAL SIGNS: BP 110/70 (BP Location: Left Arm, Patient Position: Sitting, Cuff Size: Normal)   Pulse 63   Temp 98.4 F (36.9 C) (Oral)   Ht 5\' 9"  (1.753 m)   Wt 201 lb 2 oz (91.2 kg)   SpO2 96%   BMI 29.70 kg/m  Constitutional: Well formed, well developed. No acute distress. Thorax & Lungs: No accessory muscle use Musculoskeletal: L knee.   Normal active range of motion: yes.   Normal passive range of motion: yes Tenderness to palpation: yes, over posterior knee and prox calf Deformity: no Ecchymosis: no Tests positive: None Tests negative: Patellar appreh/grind, Lachman's, varus/valgus, McMurray's, Homan's Neurologic: Normal sensory function. No focal deficits noted. Antalgic gait Psychiatric: Normal mood. Age appropriate judgment and insight. Alert & oriented x 3.    Assessment:  Strain of calf muscle, left, initial encounter - Plan: traMADol (ULTRAM) 50 MG tablet  Plan: Orders as above. Cont Tylenol, ice, heat, stretches/exercises. F/u prn. The patient voiced understanding and agreement to the plan.   Jilda Rocheicholas Paul JarrellWendling, DO 08/25/17  4:18 PM

## 2017-08-25 NOTE — Progress Notes (Signed)
Pre visit review using our clinic review tool, if applicable. No additional management support is needed unless otherwise documented below in the visit note. 

## 2017-08-27 ENCOUNTER — Ambulatory Visit: Payer: Medicare HMO | Admitting: Family

## 2017-10-18 ENCOUNTER — Ambulatory Visit: Payer: Medicare HMO | Admitting: Endocrinology

## 2017-10-20 ENCOUNTER — Encounter: Payer: Self-pay | Admitting: Endocrinology

## 2017-10-20 ENCOUNTER — Ambulatory Visit (INDEPENDENT_AMBULATORY_CARE_PROVIDER_SITE_OTHER): Payer: Medicare HMO | Admitting: Endocrinology

## 2017-10-20 VITALS — BP 134/78 | HR 56 | Ht 69.0 in | Wt 197.4 lb

## 2017-10-20 DIAGNOSIS — IMO0001 Reserved for inherently not codable concepts without codable children: Secondary | ICD-10-CM

## 2017-10-20 DIAGNOSIS — E1165 Type 2 diabetes mellitus with hyperglycemia: Secondary | ICD-10-CM

## 2017-10-20 DIAGNOSIS — Z794 Long term (current) use of insulin: Secondary | ICD-10-CM | POA: Diagnosis not present

## 2017-10-20 DIAGNOSIS — M25562 Pain in left knee: Secondary | ICD-10-CM | POA: Diagnosis not present

## 2017-10-20 DIAGNOSIS — M25569 Pain in unspecified knee: Secondary | ICD-10-CM | POA: Insufficient documentation

## 2017-10-20 LAB — POCT GLYCOSYLATED HEMOGLOBIN (HGB A1C): HEMOGLOBIN A1C: 6.1 % — AB (ref 4.0–5.6)

## 2017-10-20 MED ORDER — SITAGLIPTIN PHOSPHATE 100 MG PO TABS: 100.0000 mg | ORAL_TABLET | Freq: Every day | ORAL | 11 refills | Status: DC

## 2017-10-20 NOTE — Patient Instructions (Addendum)
check your blood sugar once a day.  vary the time of day when you check, between before the 3 meals, and at bedtime.  also check if you have symptoms of your blood sugar being too high or too low.  please keep a record of the readings and bring it to your next appointment here (or you can bring the meter itself).  You can write it on any piece of paper.  please call us sooner if your blood sugar goes below 70, or if you have a lot of readings over 200. Please continue the same medications.   Please go back to see Dr Carmelia Roller.  you will receive a phone call, about a day and time for an appointment.   Please come back for a follow-up appointment in 6 months.

## 2017-10-20 NOTE — Progress Notes (Signed)
Subjective:    Patient ID: Nicole Waters, female    DOB: Jul 07, 1945, 72 y.o.   MRN: [id number]  HPI Pt returns for f/u of diabetes mellitus: DM type: 2 Dx'ed: 1995 Complications: polyneuropathy Therapy: 3 oral meds GDM: never DKA: never Severe hypoglycemia: never.   Pancreatitis: never.  Pancreatic imaging: never.  Other: she took insulin 2015-2018.  Interval history: pt states she feels well in general, except for slight lightheadedness.  She denies hypoglycemia.   Past Medical History:  Diagnosis Date  . Diabetes mellitus without complication (HCC)   . Hyperlipidemia   . Hypertension     Past Surgical History:  Procedure Laterality Date  . CHOLECYSTECTOMY  1980  . leep surgery      Social History   Socioeconomic History  . Marital status: Widowed    Spouse name: Not on file  . Number of children: Not on file  . Years of education: Not on file  . Highest education level: Not on file  Occupational History  . Not on file  Social Needs  . Financial resource strain: Not on file  . Food insecurity:    Worry: Not on file    Inability: Not on file  . Transportation needs:    Medical: Not on file    Non-medical: Not on file  Tobacco Use  . Smoking status: Former Games developer  . Smokeless tobacco: Former Engineer, water and Sexual Activity  . Alcohol use: No    Alcohol/week: 0.0 standard drinks  . Drug use: No  . Sexual activity: Yes  Lifestyle  . Physical activity:    Days per week: Not on file    Minutes per session: Not on file  . Stress: Not on file  Relationships  . Social connections:    Talks on phone: Not on file    Gets together: Not on file    Attends religious service: Not on file    Active member of club or organization: Not on file    Attends meetings of clubs or organizations: Not on file    Relationship status: Not on file  . Intimate partner violence:    Fear of current or ex partner: Not on file    Emotionally abused: Not on file   Physically abused: Not on file    Forced sexual activity: Not on file  Other Topics Concern  . Not on file  Social History Narrative   Works part time in an in home daycare   Son- lives locally, he has 3 children   Daughter-lives locally- has 2 children   Lives alone   Completed [employer] for childcare    Current Outpatient Medications on File Prior to Visit  Medication Sig Dispense Refill  . ACCU-CHEK FASTCLIX LANCETS MISC     . ACCU-CHEK SMARTVIEW test strip USE TO CHECK BLOOD SUGAR DAILY. 100 each 1  . aspirin 81 MG tablet Take 81 mg by mouth daily.    . fluticasone (FLONASE) 50 MCG/ACT nasal spray USE 2 SPRAYS IN EACH NOSTRIL EVERY DAY 48 g 1  . lisinopril (PRINIVIL,ZESTRIL) 5 MG tablet Take 1 tablet (5 mg total) by mouth daily. 90 tablet 1  . metFORMIN (GLUCOPHAGE-XR) 500 MG 24 hr tablet TAKE 1 TABLET BY MOUTH ONCE DAILY WITH BREAKFAST 90 tablet 3  . metoprolol succinate (TOPROL-XL) 25 MG 24 hr tablet Take 1 tablet (25 mg total) by mouth daily. 90 tablet 3  . oxybutynin (DITROPAN-XL) 5 MG 24 hr tablet Take 1 tablet (5  mg total) by mouth at bedtime. 90 tablet 1  . pioglitazone (ACTOS) 30 MG tablet TAKE 1 TABLET EVERY DAY 90 tablet 3  . simvastatin (ZOCOR) 20 MG tablet Take 1 tablet (20 mg total) by mouth every evening. 90 tablet 1   No current facility-administered medications on file prior to visit.     Allergies  Allergen Reactions  . Sulfa Antibiotics Rash  . Metformin And Related     diarrhea    Family History  Problem Relation Age of Onset  . Diabetes Mother   . Cancer Father        lung  . Heart disease Father   . Diabetes Maternal Grandmother   . Heart disease Maternal Grandfather   . Heart disease Paternal Grandfather   . Diabetes Sister        died from DM complications  . Diabetes Sister        had ESRD, kidney failure, died from liver disease  . Cancer Paternal Uncle        lung  . Cancer Paternal Grandmother        ovarian    BP 134/78 (BP Location:  Left Arm, Patient Position: Sitting, Cuff Size: Normal)   Pulse (!) 56   Ht 5\' 9"  (1.753 m)   Wt 197 lb 6.4 oz (89.5 kg)   SpO2 95%   BMI 29.15 kg/m    Review of Systems Left knee pain initially improved after seeing Dr Carmelia Roller, but has since worsened again.      Objective:   Physical Exam VITAL SIGNS:  See vs page GENERAL: no distress Pulses: foot pulses are intact bilaterally.   MSK: no deformity of the feet or ankles. Left knee is slight swollen, but nontender.   CV: no edema of the legs or ankles Skin:  no ulcer on the feet or ankles.  normal color and temp on the feet and ankles Neuro: sensation is intact to touch on the feet and ankles.   A1c=6.1%     Assessment & Plan:  Type 2 DM: well-controlled Knee pain recurrent.  I ref for f/u with pt's DO.   Patient Instructions  check your blood sugar once a day.  vary the time of day when you check, between before the 3 meals, and at bedtime.  also check if you have symptoms of your blood sugar being too high or too low.  please keep a record of the readings and bring it to your next appointment here (or you can bring the meter itself).  You can write it on any piece of paper.  please call us sooner if your blood sugar goes below 70, or if you have a lot of readings over 200. Please continue the same medications.   Please go back to see Dr Carmelia Roller.  you will receive a phone call, about a day and time for an appointment.   Please come back for a follow-up appointment in 6 months.

## 2017-11-17 ENCOUNTER — Encounter: Payer: Self-pay | Admitting: Family Medicine

## 2017-11-17 ENCOUNTER — Ambulatory Visit (INDEPENDENT_AMBULATORY_CARE_PROVIDER_SITE_OTHER): Payer: Medicare HMO | Admitting: Family Medicine

## 2017-11-17 VITALS — BP 138/82 | HR 60 | Temp 97.7°F | Ht 69.0 in | Wt 197.5 lb

## 2017-11-17 DIAGNOSIS — M79605 Pain in left leg: Secondary | ICD-10-CM | POA: Diagnosis not present

## 2017-11-17 NOTE — Progress Notes (Signed)
Musculoskeletal Exam  Patient: Nicole PontoLaverne Hartin DOB: May 20, 1945  DOS: 11/17/2017  SUBJECTIVE:  Chief Complaint:   Chief Complaint  Patient presents with  . Knee Pain    left     Nicole PontoLaverne Mosco is a 72 y.o.  female for evaluation and treatment of LLE pain.   Onset:  1 month ago.  Had L calf pain, got better but then started to affect lateral component.  Location: LLE and L thigh Character:  aching  Progression of issue:  is unchanged Associated symptoms: limping, sometimes gets stiff Treatment: to date has been acetaminophen.   Neurovascular symptoms: no  ROS: Musculoskeletal/Extremities: +LLE pain  Past Medical History:  Diagnosis Date  . Diabetes mellitus without complication (HCC)   . Hyperlipidemia   . Hypertension     Objective: VITAL SIGNS: BP 138/82 (BP Location: Left Arm, Patient Position: Sitting, Cuff Size: Normal)   Pulse 60   Temp 97.7 F (36.5 C) (Oral)   Ht 5\' 9"  (1.753 m)   Wt 197 lb 8 oz (89.6 kg)   SpO2 98%   BMI 29.17 kg/m  Constitutional: Well formed, well developed. No acute distress. Thorax & Lungs: No accessory muscle use Musculoskeletal: LLE.   Normal active range of motion: yes.   Normal passive range of motion: yes Tenderness to palpation: yes, over latera compartment of leg; no ttp over greater troch bursa or lateral thigh Neg knee exam Deformity: no Ecchymosis: no Tests positive:  Tests negative: Neurologic: Normal sensory function. No focal deficits noted. Psychiatric: Normal mood. Age appropriate judgment and insight. Alert & oriented x 3.    Assessment:  Pain of left lower extremity - Plan: Ambulatory referral to Physical Therapy  Plan: Lateral leg compartment. Rolling pin/foam roller over area. Heat. Ice. Activity as tolerated. Refer PT, if improvement, cancel appt.  F/u prn. The patient voiced understanding and agreement to the plan.   Jilda Rocheicholas Paul LakewoodWendling, DO 11/17/17  1:43 PM

## 2017-11-17 NOTE — Progress Notes (Signed)
Pre visit review using our clinic review tool, if applicable. No additional management support is needed unless otherwise documented below in the visit note. 

## 2017-11-17 NOTE — Patient Instructions (Signed)
Heat (pad or rice pillow in microwave) over affected area, 10-15 minutes twice daily.   OK to take Tylenol 1000 mg (2 extra strength tabs) or 975 mg (3 regular strength tabs) every 6 hours as needed.  Mind how you are walking. Focus on putting the weight in the middle of the foot, not on the side.   If you do not hear anything about your referral in the next 1-2 weeks, call our office and ask for an update.  Let us know if you need anything.

## 2017-12-07 ENCOUNTER — Other Ambulatory Visit: Payer: Self-pay | Admitting: Family

## 2017-12-07 DIAGNOSIS — M25462 Effusion, left knee: Secondary | ICD-10-CM | POA: Diagnosis not present

## 2017-12-07 DIAGNOSIS — M25562 Pain in left knee: Secondary | ICD-10-CM | POA: Diagnosis not present

## 2017-12-07 DIAGNOSIS — M79605 Pain in left leg: Secondary | ICD-10-CM | POA: Diagnosis not present

## 2017-12-07 DIAGNOSIS — M25362 Other instability, left knee: Secondary | ICD-10-CM | POA: Diagnosis not present

## 2017-12-10 ENCOUNTER — Telehealth: Payer: Self-pay | Admitting: *Deleted

## 2017-12-10 NOTE — Telephone Encounter (Signed)
Received Physician Orders from [facility]; forwarded to referring provider/SLS 12/06

## 2017-12-20 DIAGNOSIS — M25462 Effusion, left knee: Secondary | ICD-10-CM | POA: Diagnosis not present

## 2017-12-20 DIAGNOSIS — M25562 Pain in left knee: Secondary | ICD-10-CM | POA: Diagnosis not present

## 2017-12-20 DIAGNOSIS — M25362 Other instability, left knee: Secondary | ICD-10-CM | POA: Diagnosis not present

## 2017-12-20 DIAGNOSIS — M79605 Pain in left leg: Secondary | ICD-10-CM | POA: Diagnosis not present

## 2017-12-22 DIAGNOSIS — M25462 Effusion, left knee: Secondary | ICD-10-CM | POA: Diagnosis not present

## 2017-12-22 DIAGNOSIS — M79605 Pain in left leg: Secondary | ICD-10-CM | POA: Diagnosis not present

## 2017-12-22 DIAGNOSIS — M25562 Pain in left knee: Secondary | ICD-10-CM | POA: Diagnosis not present

## 2017-12-22 DIAGNOSIS — M25362 Other instability, left knee: Secondary | ICD-10-CM | POA: Diagnosis not present

## 2017-12-31 DIAGNOSIS — M79605 Pain in left leg: Secondary | ICD-10-CM | POA: Diagnosis not present

## 2017-12-31 DIAGNOSIS — M25462 Effusion, left knee: Secondary | ICD-10-CM | POA: Diagnosis not present

## 2017-12-31 DIAGNOSIS — M25562 Pain in left knee: Secondary | ICD-10-CM | POA: Diagnosis not present

## 2017-12-31 DIAGNOSIS — M25362 Other instability, left knee: Secondary | ICD-10-CM | POA: Diagnosis not present

## 2018-01-04 DIAGNOSIS — M25562 Pain in left knee: Secondary | ICD-10-CM | POA: Diagnosis not present

## 2018-01-04 DIAGNOSIS — M25462 Effusion, left knee: Secondary | ICD-10-CM | POA: Diagnosis not present

## 2018-01-04 DIAGNOSIS — M79605 Pain in left leg: Secondary | ICD-10-CM | POA: Diagnosis not present

## 2018-01-04 DIAGNOSIS — M25362 Other instability, left knee: Secondary | ICD-10-CM | POA: Diagnosis not present

## 2018-01-24 ENCOUNTER — Telehealth: Payer: Self-pay | Admitting: *Deleted

## 2018-01-24 NOTE — Telephone Encounter (Signed)
Received End of Care plan from [facility]; forwarded to provider/SLS 01/20

## 2018-02-14 ENCOUNTER — Other Ambulatory Visit: Payer: Self-pay | Admitting: Family

## 2018-02-16 MED ORDER — OXYBUTYNIN CHLORIDE ER 5 MG PO TB24: 5.0000 mg | ORAL_TABLET | Freq: Every day | ORAL | 0 refills | Status: DC

## 2018-02-16 MED ORDER — LISINOPRIL 5 MG PO TABS: 5.0000 mg | ORAL_TABLET | Freq: Every day | ORAL | 0 refills | Status: DC

## 2018-02-16 NOTE — Addendum Note (Signed)
Addended by: Sandford Craze on: 02/16/2018 01:08 PM   Modules accepted: Orders

## 2018-02-25 NOTE — Progress Notes (Addendum)
Subjective:   Nicole PontoLaverne Waters is a 73 y.o. female who presents for Medicare Annual (Subsequent) preventive examination.  Pt enjoys word search.  Review of Systems: No ROS.  Medicare Wellness Visit. Additional risk factors are reflected in the social history. Cardiac Risk Factors include: advanced age (>3755men, 29>65 women);dyslipidemia;diabetes mellitus;hypertension Sleep patterns: wakes 2-3x to urinate.Pt states she drinks caffiene around the clock. Healthy sleep habits discussed. Home Safety/Smoke Alarms: Feels safe in home. Smoke alarms in place.  Daughter lives with her. No stairs. Step over tub. Has shower chair ordered.    Female:       Mammo- ordered       Dexa scan-  ordered      [facility]- pt will schedule  Eye- UTD per pt. Can not recall doctor's name.     Objective:     Vitals: BP 120/62 (BP Location: Left Arm, Patient Position: Sitting, Cuff Size: Normal)   Pulse 60   Ht 5\' 9"  (1.753 m)   Wt 191 lb (86.6 kg)   SpO2 96%   BMI 28.21 kg/m   Body mass index is 28.21 kg/m.  Advanced Directives 02/28/2018 02/25/2017 12/16/2015 10/16/2014  Does Patient Have a Medical Advance Directive? No No No No  Does patient want to make changes to medical advance directive? No - Patient declined - Yes (MAU/Ambulatory/Procedural Areas - Information given) -  Would patient like information on creating a medical advance directive? - Yes (MAU/Ambulatory/Procedural Areas - Information given) - Yes - Educational materials given    Tobacco Social History   Tobacco Use  Smoking Status Former Smoker  Smokeless Tobacco Former Social workerUser     Counseling given: Not Answered   Clinical Intake:     Pain : No/denies pain                 Past Medical History:  Diagnosis Date  . Diabetes mellitus without complication (HCC)   . Hyperlipidemia   . Hypertension    Past Surgical History:  Procedure Laterality Date  . CHOLECYSTECTOMY  1980  . leep surgery     Family History  Problem  Relation Age of Onset  . Diabetes Mother   . Cancer Father        lung  . Heart disease Father   . Diabetes Maternal Grandmother   . Heart disease Maternal Grandfather   . Heart disease Paternal Grandfather   . Diabetes Sister        died from DM complications  . Diabetes Sister        had ESRD, kidney failure, died from liver disease  . Cancer Paternal Uncle        lung  . Cancer Paternal Grandmother        ovarian   Social History   Socioeconomic History  . Marital status: Widowed    Spouse name: Not on file  . Number of children: Not on file  . Years of education: Not on file  . Highest education level: Not on file  Occupational History  . Not on file  Social Needs  . Financial resource strain: Not on file  . Food insecurity:    Worry: Not on file    Inability: Not on file  . Transportation needs:    Medical: Not on file    Non-medical: Not on file  Tobacco Use  . Smoking status: Former Games developermoker  . Smokeless tobacco: Former Engineer, waterUser  Substance and Sexual Activity  . Alcohol use: No    Alcohol/week:  0.0 standard drinks  . Drug use: No  . Sexual activity: Not Currently  Lifestyle  . Physical activity:    Days per week: Not on file    Minutes per session: Not on file  . Stress: Not on file  Relationships  . Social connections:    Talks on phone: Not on file    Gets together: Not on file    Attends religious service: Not on file    Active member of club or organization: Not on file    Attends meetings of clubs or organizations: Not on file    Relationship status: Not on file  Other Topics Concern  . Not on file  Social History Narrative   Works part time in an in home daycare   Son- lives locally, he has 3 children   Daughter-lives locally- has 2 children   Lives alone   Completed [employer] for childcare    Outpatient Encounter Medications as of 02/28/2018  Medication Sig  . ACCU-CHEK FASTCLIX LANCETS MISC   . ACCU-CHEK SMARTVIEW test strip USE TO CHECK BLOOD  SUGAR DAILY.  Marland Kitchen aspirin 81 MG tablet Take 81 mg by mouth daily.  . fluticasone (FLONASE) 50 MCG/ACT nasal spray USE 2 SPRAYS IN EACH NOSTRIL EVERY DAY  . lisinopril (PRINIVIL,ZESTRIL) 5 MG tablet Take 1 tablet (5 mg total) by mouth daily.  . metFORMIN (GLUCOPHAGE-XR) 500 MG 24 hr tablet TAKE 1 TABLET BY MOUTH ONCE DAILY WITH BREAKFAST  . metoprolol succinate (TOPROL-XL) 25 MG 24 hr tablet Take 1 tablet (25 mg total) by mouth daily.  Marland Kitchen oxybutynin (DITROPAN-XL) 5 MG 24 hr tablet Take 1 tablet (5 mg total) by mouth at bedtime.  . simvastatin (ZOCOR) 20 MG tablet TAKE 1 TABLET (20 MG TOTAL) BY MOUTH EVERY EVENING.  . sitaGLIPtin (JANUVIA) 100 MG tablet Take 1 tablet (100 mg total) by mouth daily.  . pioglitazone (ACTOS) 30 MG tablet TAKE 1 TABLET EVERY DAY (Patient not taking: Reported on 02/28/2018)   No facility-administered encounter medications on file as of 02/28/2018.     Activities of Daily Living In your present state of health, do you have any difficulty performing the following activities: 02/28/2018  Hearing? N  Vision? N  Difficulty concentrating or making decisions? N  Walking or climbing stairs? N  Dressing or bathing? N  Doing errands, shopping? N  Preparing Food and eating ? N  Using the Toilet? N  In the past six months, have you accidently leaked urine? Y  Do you have problems with loss of bowel control? N  Managing your Medications? N  Managing your Finances? N  Housekeeping or managing your Housekeeping? N  Some recent data might be hidden    Patient Care Team: Sandford Craze, NP as PCP - General (Internal Medicine) Romero Belling, MD as Consulting Physician (Endocrinology)    Assessment:   This is a routine wellness examination for Janari. Physical assessment deferred to PCP.  Exercise Activities and Dietary recommendations Current Exercise Habits: The patient does not participate in regular exercise at present, Exercise limited by: None identified Diet  (meal preparation, eat out, water intake, caffeinated beverages, dairy products, fruits and vegetables): well balanced, on average, 2 meals per day   Goals    . Decrease caffeine in evening    . Increase physical activity     Pt states she is going to start going to the gym once per week.        Fall Risk Fall Risk  02/28/2018  02/25/2017 10/19/2016 12/16/2015 11/18/2015  Falls in the past year? 0 No No No No  Risk for fall due to : - - - - -  Risk for fall due to: Comment - - - - -   Depression Screen PHQ 2/9 Scores 02/28/2018 02/25/2017 03/23/2016 12/16/2015  PHQ - 2 Score 0 0 1 0  PHQ- 9 Score - - 10 -     Cognitive Function MMSE - Mini Mental State Exam 02/28/2018 02/25/2017 12/16/2015  Orientation to time 5 5 5   Orientation to Place 5 5 5   Registration 3 3 3   Attention/ Calculation 5 5 5   Recall 2 3 3   Language- name 2 objects 2 2 2   Language- repeat 1 1 1   Language- follow 3 step command 3 3 3   Language- read & follow direction 1 1 1   Write a sentence 1 1 1   Copy design 1 1 1   Total score 29 30 30         Immunization History  Administered Date(s) Administered  . Influenza, High Dose Seasonal PF 10/16/2014, 09/30/2015, 10/19/2016  . Influenza,inj,Quad PF,6+ Mos 11/18/2015  . Pneumococcal Conjugate-13 10/16/2014  . Pneumococcal Polysaccharide-23 11/18/2015  . Td 10/16/2014  . Tdap 04/08/2010  . Zoster 01/05/2013    Screening Tests Health Maintenance  Topic Date Due  . Hepatitis C Screening  22-May-1945  . MAMMOGRAM  11/20/2016  . OPHTHALMOLOGY EXAM  02/05/2017  . HEMOGLOBIN A1C  04/21/2018  . FOOT EXAM  10/21/2018  . COLONOSCOPY  01/05/2022  . TETANUS/TDAP  10/15/2024  . INFLUENZA VACCINE  Completed  . DEXA SCAN  Completed  . PNA vac Low Risk Adult  Completed      Plan:    Please schedule your next medicare wellness visit with me in 1 yr.  Continue to eat heart healthy diet (full of fruits, vegetables, whole grains, lean protein, water--limit salt,  fat, and sugar intake) and increase physical activity as tolerated.  Continue doing brain stimulating activities (puzzles, reading, adult coloring books, staying active) to keep memory sharp.   Bring a copy of your living will and/or healthcare power of attorney to your next office visit.  I have ordered your bone density scan and mammogram. Please schedule.  I have personally reviewed and noted the following in the patient's chart:   . Medical and social history . Use of alcohol, tobacco or illicit drugs  . Current medications and supplements . Functional ability and status . Nutritional status . Physical activity . Advanced directives . List of other physicians . Hospitalizations, surgeries, and ER visits in previous 12 months . Vitals . Screenings to include cognitive, depression, and falls . Referrals and appointments  In addition, I have reviewed and discussed with patient certain preventive protocols, quality metrics, and best practice recommendations. A written personalized care plan for preventive services as well as general preventive health recommendations were provided to patient.     Mady Haagensen [place], [place]  02/28/2018  Willow Ora, MD

## 2018-02-28 ENCOUNTER — Telehealth: Payer: Self-pay | Admitting: Family

## 2018-02-28 ENCOUNTER — Encounter: Payer: Self-pay | Admitting: *Deleted

## 2018-02-28 ENCOUNTER — Ambulatory Visit (INDEPENDENT_AMBULATORY_CARE_PROVIDER_SITE_OTHER): Payer: Medicare HMO | Admitting: *Deleted

## 2018-02-28 VITALS — BP 120/62 | HR 60 | Ht 69.0 in | Wt 191.0 lb

## 2018-02-28 DIAGNOSIS — Z0000 Encounter for general adult medical examination without abnormal findings: Secondary | ICD-10-CM

## 2018-02-28 DIAGNOSIS — Z780 Asymptomatic menopausal state: Secondary | ICD-10-CM

## 2018-02-28 DIAGNOSIS — Z1231 Encounter for screening mammogram for malignant neoplasm of breast: Secondary | ICD-10-CM

## 2018-02-28 NOTE — Patient Instructions (Signed)
Please schedule your next medicare wellness visit with me in 1 yr.  Continue to eat heart healthy diet (full of fruits, vegetables, whole grains, lean protein, water--limit salt, fat, and sugar intake) and increase physical activity as tolerated.  Continue doing brain stimulating activities (puzzles, reading, adult coloring books, staying active) to keep memory sharp.   Bring a copy of your living will and/or healthcare power of attorney to your next office visit.  I have ordered your bone density scan and mammogram. Please schedule.   Nicole Waters , Thank you for taking time to come for your Medicare Wellness Visit. I appreciate your ongoing commitment to your health goals. Please review the following plan we discussed and let me know if I can assist you in the future.   These are the goals we discussed: Goals    . Decrease caffeine in evening    . Increase physical activity     Pt states she is going to start going to the gym once per week.        This is a list of the screening recommended for you and due dates:  Health Maintenance  Topic Date Due  .  Hepatitis C: One time screening is recommended by Center for Disease Control  (CDC) for  adults born from [birth year] through 1964.   [birth year]  . Mammogram  11/20/2016  . Eye exam for diabetics  02/05/2017  . Hemoglobin A1C  04/21/2018  . Complete foot exam   10/21/2018  . Colon Cancer Screening  01/05/2022  . Tetanus Vaccine  10/15/2024  . Flu Shot  Completed  . DEXA scan (bone density measurement)  Completed  . Pneumonia vaccines  Completed    Health Maintenance After Age 19 After age 2, you are at a higher risk for certain long-term diseases and infections as well as injuries from falls. Falls are a major cause of broken bones and head injuries in people who are older than age 54. Getting regular preventive care can help to keep you healthy and well. Preventive care includes getting regular testing and making lifestyle  changes as recommended by your health care provider. Talk with your health care provider about:  Which screenings and tests you should have. A screening is a test that checks for a disease when you have no symptoms.  A diet and exercise plan that is right for you. What should I know about screenings and tests to prevent falls? Screening and testing are the best ways to find a health problem early. Early diagnosis and treatment give you the best chance of managing medical conditions that are common after age 62. Certain conditions and lifestyle choices may make you more likely to have a fall. Your health care provider may recommend:  Regular vision checks. Poor vision and conditions such as cataracts can make you more likely to have a fall. If you wear glasses, make sure to get your prescription updated if your vision changes.  Medicine review. Work with your health care provider to regularly review all of the medicines you are taking, including over-the-counter medicines. Ask your health care provider about any side effects that may make you more likely to have a fall. Tell your health care provider if any medicines that you take make you feel dizzy or sleepy.  Osteoporosis screening. Osteoporosis is a condition that causes the bones to get weaker. This can make the bones weak and cause them to break more easily.  Blood pressure screening. Blood pressure changes and  medicines to control blood pressure can make you feel dizzy.  Strength and balance checks. Your health care provider may recommend certain tests to check your strength and balance while standing, walking, or changing positions.  Foot health exam. Foot pain and numbness, as well as not wearing proper footwear, can make you more likely to have a fall.  Depression screening. You may be more likely to have a fall if you have a fear of falling, feel emotionally low, or feel unable to do activities that you used to do.  Alcohol use  screening. Using too much alcohol can affect your balance and may make you more likely to have a fall. What actions can I take to lower my risk of falls? General instructions  Talk with your health care provider about your risks for falling. Tell your health care provider if: ? You fall. Be sure to tell your health care provider about all falls, even ones that seem minor. ? You feel dizzy, sleepy, or off-balance.  Take over-the-counter and prescription medicines only as told by your health care provider. These include any supplements.  Eat a healthy diet and maintain a healthy weight. A healthy diet includes low-fat dairy products, low-fat (lean) meats, and fiber from whole grains, beans, and lots of fruits and vegetables. Home safety  Remove any tripping hazards, such as rugs, cords, and clutter.  Install safety equipment such as grab bars in bathrooms and safety rails on stairs.  Keep rooms and walkways well-lit. Activity   Follow a regular exercise program to stay fit. This will help you maintain your balance. Ask your health care provider what types of exercise are appropriate for you.  If you need a cane or walker, use it as recommended by your health care provider.  Wear supportive shoes that have nonskid soles. Lifestyle  Do not drink alcohol if your health care provider tells you not to drink.  If you drink alcohol, limit how much you have: ? 0-1 drink a day for women. ? 0-2 drinks a day for men.  Be aware of how much alcohol is in your drink. In the [place] one drink equals one typical bottle of beer (12 oz), one-half glass of wine (5 oz), or one shot of hard liquor (1 oz).  Do not use any products that contain nicotine or tobacco, such as cigarettes and e-cigarettes. If you need help quitting, ask your health care provider. Summary  Having a healthy lifestyle and getting preventive care can help to protect your health and wellness after age 33.  Screening and testing  are the best way to find a health problem early and help you avoid having a fall. Early diagnosis and treatment give you the best chance for managing medical conditions that are more common for people who are older than age 54.  Falls are a major cause of broken bones and head injuries in people who are older than age 49. Take precautions to prevent a fall at home.  Work with your health care provider to learn what changes you can make to improve your health and wellness and to prevent falls. This information is not intended to replace advice given to you by your health care provider. Make sure you discuss any questions you have with your health care provider. Document Released: 11/04/2016 Document Revised: 11/04/2016 Document Reviewed: 11/04/2016 Elsevier Interactive Patient Education  2019 [employer].

## 2018-02-28 NOTE — Telephone Encounter (Signed)
Copied from CRM [id number]. Topic: Quick Communication - See Telephone Encounter >> Feb 28, 2018  3:47 PM Maia Petties wrote: CRM for notification. See Telephone encounter for: 02/28/18.  Pt states [employer] stopped sending pioglitazone (ACTOS) 30 MG tablet. She hasn't taken in about 6 months but isn't sure if she should take it or not. Did the doctor d/c? Please advise.  Pt wants to have mammogram. 11/2014 pt had screening MM followed by Diagnostic MM and [place] and it was recommended to have another Diagnostic MM w/ [place] in 6 months with she never had. [facility] can only do screening MM. If pt wants in [facility] she will have to go to [facility]. Otherwise she can go to [facility] in [place]. Pt prefers to stay in [facility] so it is easier for her to get there. Please change order to Diagnostic MM with [place] and sent to [facility]. Notify pt when sent.

## 2018-03-01 ENCOUNTER — Telehealth: Payer: Self-pay | Admitting: Family

## 2018-03-01 DIAGNOSIS — R928 Other abnormal and inconclusive findings on diagnostic imaging of breast: Secondary | ICD-10-CM

## 2018-03-01 NOTE — Telephone Encounter (Signed)
-----   Message from Destiny A Best sent at 02/28/2018  2:50 PM EST ----- Regarding: Mammogram Nicole Waters,     One of your patient came down today to schedule a screening mammogram that Dr. Drue Novel had put in, according to her last mammogram she needed to return for a 6 month follow up and [place]. She advised she did not follow up with that, could you please put in a diagnostic mammo and [place] order for the patient. She also advised she would like to have this done somewhere in [facility] and not [facility], as it is too far for her.  Thank you!

## 2018-03-02 NOTE — Telephone Encounter (Signed)
Patient advised to stay off the medication;

## 2018-03-02 NOTE — Telephone Encounter (Signed)
OK to remain off actos.  Please notify pt. I addressed mammogram orders with her yesterday.

## 2018-03-03 ENCOUNTER — Ambulatory Visit (HOSPITAL_BASED_OUTPATIENT_CLINIC_OR_DEPARTMENT_OTHER)
Admission: RE | Admit: 2018-03-03 | Discharge: 2018-03-03 | Disposition: A | Discharge status: Home / Self Care | Payer: Medicare HMO | Source: Ambulatory Visit | Attending: Internal Medicine | Admitting: Internal Medicine

## 2018-03-03 DIAGNOSIS — Z780 Asymptomatic menopausal state: Secondary | ICD-10-CM | POA: Diagnosis not present

## 2018-03-03 DIAGNOSIS — M85852 Other specified disorders of bone density and structure, left thigh: Secondary | ICD-10-CM | POA: Diagnosis not present

## 2018-03-07 ENCOUNTER — Encounter: Payer: Self-pay | Admitting: Family

## 2018-03-14 DIAGNOSIS — N6324 Unspecified lump in the left breast, lower inner quadrant: Secondary | ICD-10-CM | POA: Diagnosis not present

## 2018-03-14 DIAGNOSIS — N6002 Solitary cyst of left breast: Secondary | ICD-10-CM | POA: Diagnosis not present

## 2018-03-14 DIAGNOSIS — R928 Other abnormal and inconclusive findings on diagnostic imaging of breast: Secondary | ICD-10-CM | POA: Diagnosis not present

## 2018-03-15 ENCOUNTER — Ambulatory Visit (INDEPENDENT_AMBULATORY_CARE_PROVIDER_SITE_OTHER): Payer: Medicare HMO | Admitting: Family

## 2018-03-15 ENCOUNTER — Encounter: Payer: Self-pay | Admitting: Family

## 2018-03-15 VITALS — BP 133/86 | HR 72 | Temp 98.5°F | Resp 16 | Ht 69.0 in | Wt 190.6 lb

## 2018-03-15 DIAGNOSIS — Z0000 Encounter for general adult medical examination without abnormal findings: Secondary | ICD-10-CM

## 2018-03-15 DIAGNOSIS — E785 Hyperlipidemia, unspecified: Secondary | ICD-10-CM | POA: Diagnosis not present

## 2018-03-15 DIAGNOSIS — E1165 Type 2 diabetes mellitus with hyperglycemia: Secondary | ICD-10-CM | POA: Diagnosis not present

## 2018-03-15 DIAGNOSIS — N3281 Overactive bladder: Secondary | ICD-10-CM

## 2018-03-15 LAB — COMPREHENSIVE METABOLIC PANEL
ALT: 18 U/L (ref 0–35)
AST: 17 U/L (ref 0–37)
Albumin: 4 g/dL (ref 3.5–5.2)
Alkaline Phosphatase: 47 U/L (ref 39–117)
BUN: 10 mg/dL (ref 6–23)
CHLORIDE: 100 meq/L (ref 96–112)
CO2: 28 meq/L (ref 19–32)
CREATININE: 0.74 mg/dL (ref 0.40–1.20)
Calcium: 9.3 mg/dL (ref 8.4–10.5)
GFR: 93.52 mL/min (ref >60.00)
Glucose, Bld: 280 mg/dL — ABNORMAL HIGH (ref 70–99)
POTASSIUM: 4 meq/L (ref 3.5–5.1)
Sodium: 137 mEq/L (ref 135–145)
Total Bilirubin: 0.5 mg/dL (ref 0.2–1.2)
Total Protein: 7 g/dL (ref 6.0–8.3)

## 2018-03-15 LAB — LIPID PANEL
CHOL/HDL RATIO: 3
Cholesterol: 111 mg/dL (ref 0–200)
HDL: 38.3 mg/dL — ABNORMAL LOW (ref >39.00)
NONHDL: 72.75
Triglycerides: 210 mg/dL — ABNORMAL HIGH (ref 0.0–149.0)
VLDL: 42 mg/dL — ABNORMAL HIGH (ref 0.0–40.0)

## 2018-03-15 LAB — LDL CHOLESTEROL, DIRECT: Direct LDL: 54 mg/dL

## 2018-03-15 MED ORDER — ACCU-CHEK SMARTVIEW VI STRP: ORAL_STRIP | 1 refills | Status: DC

## 2018-03-15 NOTE — Progress Notes (Signed)
Subjective:    Patient ID: Nicole Waters, female    DOB: 07/17/1945, 73 y.o.   MRN: [id number]  HPI  DM2- she continues with endo. Reports home sugars are stable.  Lab Results  Component Value Date   HGBA1C 6.1 (A) 10/20/2017   HGBA1C 6.0 09/11/2016   HGBA1C 6.3 06/10/2016   Lab Results  Component Value Date   MICROALBUR 0.3 03/23/2016   LDLCALC 56 10/19/2016   CREATININE 0.78 02/25/2017   OAB- continues oxybutynin- still having symptoms but better on medication.   HTN-On metoprolol and lisinopril.  BP Readings from Last 3 Encounters:  03/15/18 133/86  02/28/18 120/62  11/17/17 138/82   Hyperlipidemia- on simvastatin.    Lab Results  Component Value Date   CHOL 113 10/19/2016   HDL 36.50 (L) 10/19/2016   LDLCALC 56 10/19/2016   TRIG 102.0 10/19/2016   CHOLHDL 3 10/19/2016      Review of Systems See HPI  Past Medical History:  Diagnosis Date  . Diabetes mellitus without complication (HCC)   . Hyperlipidemia   . Hypertension      Social History   Socioeconomic History  . Marital status: Widowed    Spouse name: Not on file  . Number of children: Not on file  . Years of education: Not on file  . Highest education level: Not on file  Occupational History  . Not on file  Social Needs  . Financial resource strain: Not on file  . Food insecurity:    Worry: Not on file    Inability: Not on file  . Transportation needs:    Medical: Not on file    Non-medical: Not on file  Tobacco Use  . Smoking status: Former Games developer  . Smokeless tobacco: Former Engineer, water and Sexual Activity  . Alcohol use: No    Alcohol/week: 0.0 standard drinks  . Drug use: No  . Sexual activity: Not Currently  Lifestyle  . Physical activity:    Days per week: Not on file    Minutes per session: Not on file  . Stress: Not on file  Relationships  . Social connections:    Talks on phone: Not on file    Gets together: Not on file    Attends religious service: Not  on file    Active member of club or organization: Not on file    Attends meetings of clubs or organizations: Not on file    Relationship status: Not on file  . Intimate partner violence:    Fear of current or ex partner: Not on file    Emotionally abused: Not on file    Physically abused: Not on file    Forced sexual activity: Not on file  Other Topics Concern  . Not on file  Social History Narrative   Works part time in an in home daycare   Son- lives locally, he has 3 children   Daughter-lives locally- has 2 children   Lives alone   Completed [employer] for childcare    Past Surgical History:  Procedure Laterality Date  . CHOLECYSTECTOMY  1980  . leep surgery      Family History  Problem Relation Age of Onset  . Diabetes Mother   . Cancer Father        lung  . Heart disease Father   . Diabetes Maternal Grandmother   . Heart disease Maternal Grandfather   . Heart disease Paternal Grandfather   . Diabetes Sister  died from DM complications  . Diabetes Sister        had ESRD, kidney failure, died from liver disease  . Cancer Paternal Uncle        lung  . Cancer Paternal Grandmother        ovarian    Allergies  Allergen Reactions  . Sulfa Antibiotics Rash  . Metformin And Related     diarrhea    Current Outpatient Medications on File Prior to Visit  Medication Sig Dispense Refill  . ACCU-CHEK FASTCLIX LANCETS MISC     . aspirin 81 MG tablet Take 81 mg by mouth daily.    . fluticasone (FLONASE) 50 MCG/ACT nasal spray USE 2 SPRAYS IN EACH NOSTRIL EVERY DAY 48 g 1  . lisinopril (PRINIVIL,ZESTRIL) 5 MG tablet Take 1 tablet (5 mg total) by mouth daily. 90 tablet 0  . metFORMIN (GLUCOPHAGE-XR) 500 MG 24 hr tablet TAKE 1 TABLET BY MOUTH ONCE DAILY WITH BREAKFAST 90 tablet 3  . metoprolol succinate (TOPROL-XL) 25 MG 24 hr tablet Take 1 tablet (25 mg total) by mouth daily. 90 tablet 3  . oxybutynin (DITROPAN-XL) 5 MG 24 hr tablet Take 1 tablet (5 mg total) by mouth at  bedtime. 90 tablet 0  . simvastatin (ZOCOR) 20 MG tablet TAKE 1 TABLET (20 MG TOTAL) BY MOUTH EVERY EVENING. 90 tablet 0  . sitaGLIPtin (JANUVIA) 100 MG tablet Take 1 tablet (100 mg total) by mouth daily. 30 tablet 11   No current facility-administered medications on file prior to visit.     BP 133/86 (BP Location: Right Arm, Patient Position: Sitting, Cuff Size: Small)   Pulse 72   Temp 98.5 F (36.9 C) (Oral)   Resp 16   Ht 5\' 9"  (1.753 m)   Wt 190 lb 9.6 oz (86.5 kg)   SpO2 96%   BMI 28.15 kg/m       Objective:   Physical Exam Constitutional:      Appearance: She is well-developed.  Neck:     Musculoskeletal: Neck supple.     Thyroid: No thyromegaly.  Cardiovascular:     Rate and Rhythm: Normal rate and regular rhythm.     Heart sounds: Normal heart sounds. No murmur.  Pulmonary:     Effort: Pulmonary effort is normal. No respiratory distress.     Breath sounds: Normal breath sounds. No wheezing.  Skin:    General: Skin is warm and dry.  Neurological:     Mental Status: She is alert and oriented to person, place, and time.  Psychiatric:        Behavior: Behavior normal.        Thought Content: Thought content normal.        Judgment: Judgment normal.           Assessment & Plan:  HTN- bp is stable.  Continue current meds.  OAB- fair control on oxybutynin.  Please evaluate and treat.   Hyperlipidemia- obtain follow up lipid panel. Continue statin.   DM2- clinically stable. Defer management to endocrinology.

## 2018-03-15 NOTE — Patient Instructions (Signed)
Please complete lab work prior to leaving.   

## 2018-03-16 ENCOUNTER — Telehealth: Payer: Self-pay | Admitting: Family

## 2018-03-16 NOTE — Telephone Encounter (Signed)
Please advise pt that sugar is elevated at 280. She should schedule follow up with endocrinology.

## 2018-03-17 NOTE — Telephone Encounter (Signed)
Patient advised of results she has appointment coming up with endo.

## 2018-03-27 ENCOUNTER — Telehealth: Payer: Self-pay | Admitting: Family

## 2018-03-27 NOTE — Telephone Encounter (Signed)
Copied from CRM [id number]. Topic: Quick Communication - Rx Refill/Question >> Mar 27, 2018  1:26 PM Elliot Gault wrote:  Patient lvm advising mail order never received test strips.  Medication: ACCU-CHEK SMARTVIEW test strip   Preferred Pharmacy (with phone number or street name): [facility] - [place], [place] - [address] [phone] (Phone) [phone] (Fax)  Agent: Please be advised that RX refills may take up to 3 business days. We ask that you follow-up with your pharmacy.

## 2018-03-31 NOTE — Telephone Encounter (Signed)
Rx was sent to [facility] 03-15-2018

## 2018-04-01 MED ORDER — ACCU-CHEK SMARTVIEW VI STRP: ORAL_STRIP | 1 refills | Status: DC

## 2018-04-01 NOTE — Telephone Encounter (Signed)
Tests strips sent.

## 2018-04-01 NOTE — Telephone Encounter (Signed)
Pt called in to be advised. Pt say that she has spoken with [facility] and they are stating that they do not have Rx for Test strips.   Pt would like to know if this could be refaxed to them for her?   Please assist/

## 2018-04-01 NOTE — Addendum Note (Signed)
Addended byConrad Multnomah D on: 04/01/2018 02:58 PM   Modules accepted: Orders

## 2018-04-18 ENCOUNTER — Other Ambulatory Visit: Payer: Self-pay | Admitting: Family

## 2018-04-20 ENCOUNTER — Ambulatory Visit: Payer: Medicare HMO | Admitting: Endocrinology

## 2018-04-21 ENCOUNTER — Encounter: Payer: Self-pay | Admitting: Family

## 2018-04-25 ENCOUNTER — Other Ambulatory Visit: Payer: Self-pay

## 2018-04-25 MED ORDER — METFORMIN HCL ER 500 MG PO TB24: ORAL_TABLET | ORAL | 3 refills | Status: AC

## 2018-04-26 ENCOUNTER — Other Ambulatory Visit: Payer: Self-pay | Admitting: Family

## 2018-04-26 MED ORDER — METOPROLOL SUCCINATE ER 25 MG PO TB24: 25.0000 mg | ORAL_TABLET | Freq: Every day | ORAL | 1 refills | Status: DC

## 2018-04-26 NOTE — Telephone Encounter (Signed)
Requested Prescriptions  Pending Prescriptions Disp Refills  . metoprolol succinate (TOPROL-XL) 25 MG 24 hr tablet 90 tablet 1    Sig: Take 1 tablet (25 mg total) by mouth daily.     Cardiovascular:  Beta Blockers Passed - 04/26/2018  3:41 PM      Passed - Last BP in normal range    BP Readings from Last 1 Encounters:  03/15/18 133/86         Passed - Last Heart Rate in normal range    Pulse Readings from Last 1 Encounters:  03/15/18 72         Passed - Valid encounter within last 6 months    Recent Outpatient Visits          1 month ago Hyperlipidemia, unspecified hyperlipidemia type   [facility] representative at [facility], [place], NP   5 months ago Pain of left lower extremity   Holiday representative at [facility], [place], [place]   8 months ago Strain of calf muscle, left, initial encounter   [employer], [place], DO   8 months ago Pain of left lower extremity   Holiday representative at [facility], [place], NP   1 year ago OAB (overactive bladder)   Holiday representative at [facility], [place], NP      Future Appointments            In 4 months Sandford Craze, NP [facility] at [facility], [place]   In 10 months [place], [place] A, G[facility] at [facility], [place]

## 2018-05-24 ENCOUNTER — Other Ambulatory Visit: Payer: Self-pay

## 2018-05-24 ENCOUNTER — Encounter: Payer: Self-pay | Admitting: Endocrinology

## 2018-05-24 ENCOUNTER — Ambulatory Visit (INDEPENDENT_AMBULATORY_CARE_PROVIDER_SITE_OTHER): Payer: Medicare HMO | Admitting: Endocrinology

## 2018-05-24 DIAGNOSIS — E1165 Type 2 diabetes mellitus with hyperglycemia: Secondary | ICD-10-CM

## 2018-05-24 NOTE — Progress Notes (Signed)
Subjective:    Patient ID: Nicole Waters, female    DOB: [birth year], 73 y.o.   MRN: [id number]  HPI  telehealth visit today via phone x 11 minutes.   Alternatives to telehealth are presented to this patient, and the patient agrees to the telehealth visit. Pt is advised of the cost of the visit, and agrees to this, also.   Patient is at home, and I am at the office.   Persons attending the telehealth visit: the patient and I.   Pt returns for f/u of diabetes mellitus: DM type: 2 Dx'ed: 1995 Complications: polyneuropathy Therapy: 2 oral meds GDM: never DKA: never Severe hypoglycemia: never.   Pancreatitis: never.  Pancreatic imaging: never.  Other: she took insulin 2015-2018.  Interval history: pt states she feels well in general, except for slight lightheadedness.  She says cbg's are in the 100's.  She is not sure why pioglitazone was stopped.  Epic says insurance stopped sending it.    Past Medical History:  Diagnosis Date  . Diabetes mellitus without complication (HCC)   . Hyperlipidemia   . Hypertension     Past Surgical History:  Procedure Laterality Date  . CHOLECYSTECTOMY  1980  . leep surgery      Social History   Socioeconomic History  . Marital status: Widowed    Spouse name: Not on file  . Number of children: Not on file  . Years of education: Not on file  . Highest education level: Not on file  Occupational History  . Not on file  Social Needs  . Financial resource strain: Not on file  . Food insecurity:    Worry: Not on file    Inability: Not on file  . Transportation needs:    Medical: Not on file    Non-medical: Not on file  Tobacco Use  . Smoking status: Former Games developer  . Smokeless tobacco: Former Engineer, water and Sexual Activity  . Alcohol use: No    Alcohol/week: 0.0 standard drinks  . Drug use: No  . Sexual activity: Not Currently  Lifestyle  . Physical activity:    Days per week: Not on file    Minutes per session: Not on  file  . Stress: Not on file  Relationships  . Social connections:    Talks on phone: Not on file    Gets together: Not on file    Attends religious service: Not on file    Active member of club or organization: Not on file    Attends meetings of clubs or organizations: Not on file    Relationship status: Not on file  . Intimate partner violence:    Fear of current or ex partner: Not on file    Emotionally abused: Not on file    Physically abused: Not on file    Forced sexual activity: Not on file  Other Topics Concern  . Not on file  Social History Narrative   Works part time in an in home daycare   Son- lives locally, he has 3 children   Daughter-lives locally- has 2 children   Lives alone   Completed [employer] for childcare    Current Outpatient Medications on File Prior to Visit  Medication Sig Dispense Refill  . ACCU-CHEK FASTCLIX LANCETS MISC     . ACCU-CHEK SMARTVIEW test strip USE TO CHECK BLOOD SUGAR DAILY. 100 each 1  . aspirin 81 MG tablet Take 81 mg by mouth daily.    . fluticasone (FLONASE)  50 MCG/ACT nasal spray USE 2 SPRAYS IN EACH NOSTRIL EVERY DAY 48 g 1  . lisinopril (PRINIVIL,ZESTRIL) 5 MG tablet Take 1 tablet (5 mg total) by mouth daily. 90 tablet 0  . metFORMIN (GLUCOPHAGE-XR) 500 MG 24 hr tablet TAKE 1 TABLET BY MOUTH ONCE DAILY WITH BREAKFAST 90 tablet 3  . metoprolol succinate (TOPROL-XL) 25 MG 24 hr tablet Take 1 tablet (25 mg total) by mouth daily. 90 tablet 1  . oxybutynin (DITROPAN-XL) 5 MG 24 hr tablet Take 1 tablet (5 mg total) by mouth at bedtime. 90 tablet 0  . simvastatin (ZOCOR) 20 MG tablet TAKE 1 TABLET (20 MG TOTAL) BY MOUTH EVERY EVENING. 90 tablet 0  . sitaGLIPtin (JANUVIA) 100 MG tablet Take 1 tablet (100 mg total) by mouth daily. 30 tablet 11   No current facility-administered medications on file prior to visit.     Allergies  Allergen Reactions  . Sulfa Antibiotics Rash  . Metformin And Related     diarrhea    Family History   Problem Relation Age of Onset  . Diabetes Mother   . Cancer Father        lung  . Heart disease Father   . Diabetes Maternal Grandmother   . Heart disease Maternal Grandfather   . Heart disease Paternal Grandfather   . Diabetes Sister        died from DM complications  . Diabetes Sister        had ESRD, kidney failure, died from liver disease  . Cancer Paternal Uncle        lung  . Cancer Paternal Grandmother        ovarian     Review of Systems She denies hypoglycemia    Objective:   Physical Exam      Assessment & Plan:  Type 2 DM, with PN: uncertain glycemic control.     Patient Instructions  check your blood sugar once a day.  vary the time of day when you check, between before the 3 meals, and at bedtime.  also check if you have symptoms of your blood sugar being too high or too low.  please keep a record of the readings and bring it to your next appointment here (or you can bring the meter itself).  You can write it on any piece of paper.  please call us sooner if your blood sugar goes below 70, or if you have a lot of readings over 200. Please continue the same medications.  Please go the med center [facility], to have the blood test.  We'll let you know about the results.  Please come back for a follow-up appointment in 6 months.

## 2018-05-24 NOTE — Patient Instructions (Addendum)
check your blood sugar once a day.  vary the time of day when you check, between before the 3 meals, and at bedtime.  also check if you have symptoms of your blood sugar being too high or too low.  please keep a record of the readings and bring it to your next appointment here (or you can bring the meter itself).  You can write it on any piece of paper.  please call us sooner if your blood sugar goes below 70, or if you have a lot of readings over 200. Please continue the same medications.  Please go the med center [facility], to have the blood test.  We'll let you know about the results.  Please come back for a follow-up appointment in 6 months.

## 2018-06-08 ENCOUNTER — Other Ambulatory Visit: Payer: Self-pay | Admitting: Family

## 2018-06-22 DIAGNOSIS — L680 Hirsutism: Secondary | ICD-10-CM | POA: Diagnosis not present

## 2018-06-22 DIAGNOSIS — E11649 Type 2 diabetes mellitus with hypoglycemia without coma: Secondary | ICD-10-CM | POA: Diagnosis not present

## 2018-06-22 DIAGNOSIS — N871 Moderate cervical dysplasia: Secondary | ICD-10-CM | POA: Diagnosis not present

## 2018-06-22 DIAGNOSIS — E785 Hyperlipidemia, unspecified: Secondary | ICD-10-CM | POA: Diagnosis not present

## 2018-06-22 DIAGNOSIS — I10 Essential (primary) hypertension: Secondary | ICD-10-CM | POA: Diagnosis not present

## 2018-06-22 DIAGNOSIS — N3281 Overactive bladder: Secondary | ICD-10-CM | POA: Diagnosis not present

## 2018-06-22 DIAGNOSIS — Z86010 Personal history of colonic polyps: Secondary | ICD-10-CM | POA: Diagnosis not present

## 2018-06-22 DIAGNOSIS — Z9889 Other specified postprocedural states: Secondary | ICD-10-CM | POA: Diagnosis not present

## 2018-06-27 DIAGNOSIS — I10 Essential (primary) hypertension: Secondary | ICD-10-CM | POA: Diagnosis not present

## 2018-06-27 DIAGNOSIS — E785 Hyperlipidemia, unspecified: Secondary | ICD-10-CM | POA: Diagnosis not present

## 2018-06-27 DIAGNOSIS — L680 Hirsutism: Secondary | ICD-10-CM | POA: Diagnosis not present

## 2018-06-27 DIAGNOSIS — E11649 Type 2 diabetes mellitus with hypoglycemia without coma: Secondary | ICD-10-CM | POA: Diagnosis not present

## 2018-08-06 IMAGING — US US EXTREM LOW VENOUS*L*
1 series · 13 of 24 positions shown · non-contrast
Comparison: None.

CLINICAL DATA: Left lower extremity pain



[Series 1: us extrem low venous*left* · 0.08mm/px · 13 of 29 slices shown]
[im 1/29]
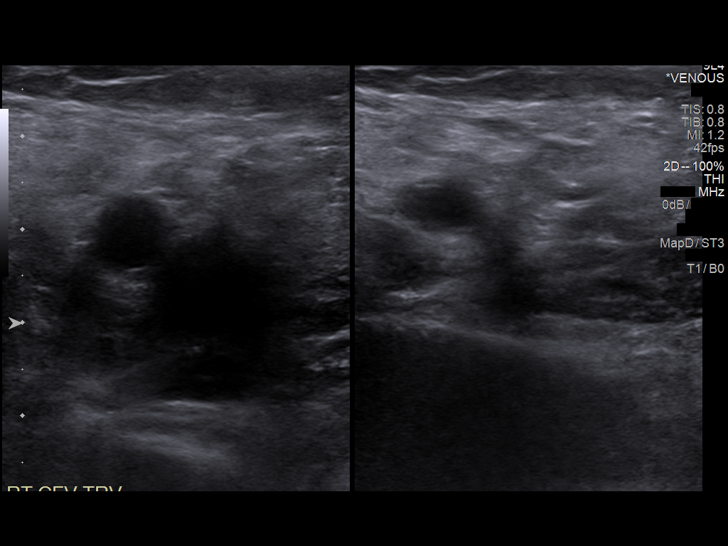
[im 3/29]
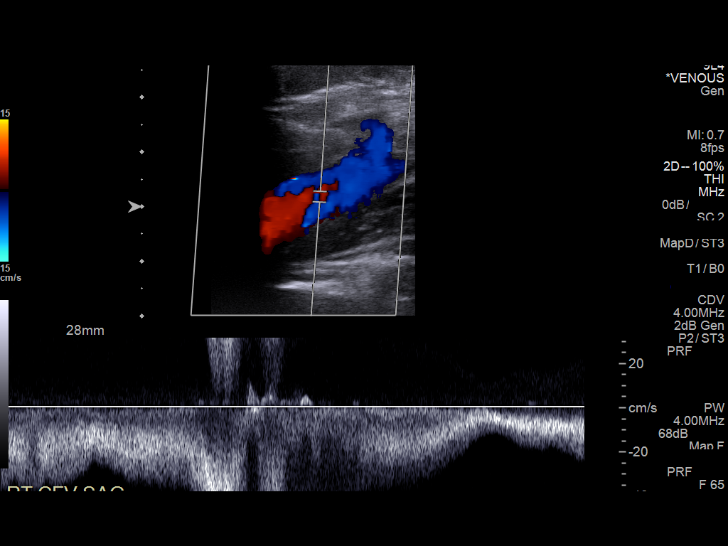
[im 5/29]
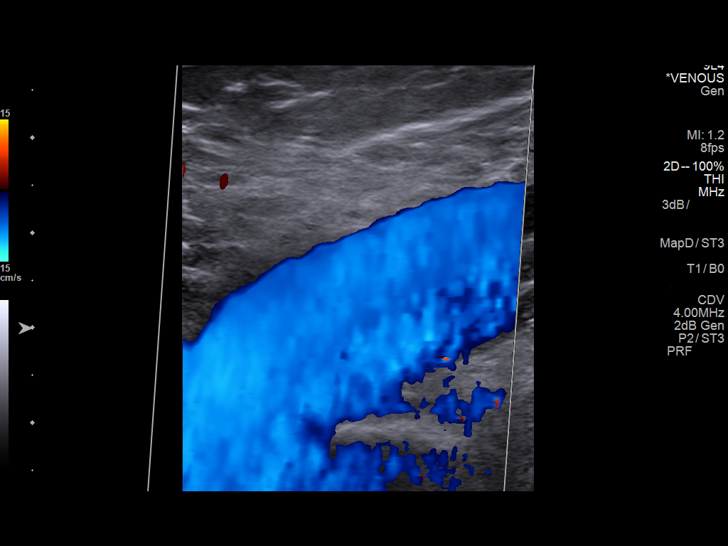
[im 8/29]
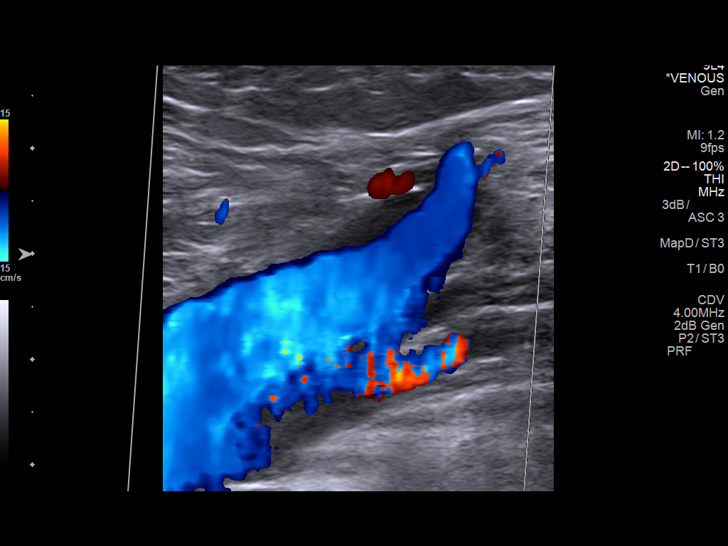
[im 10/29]
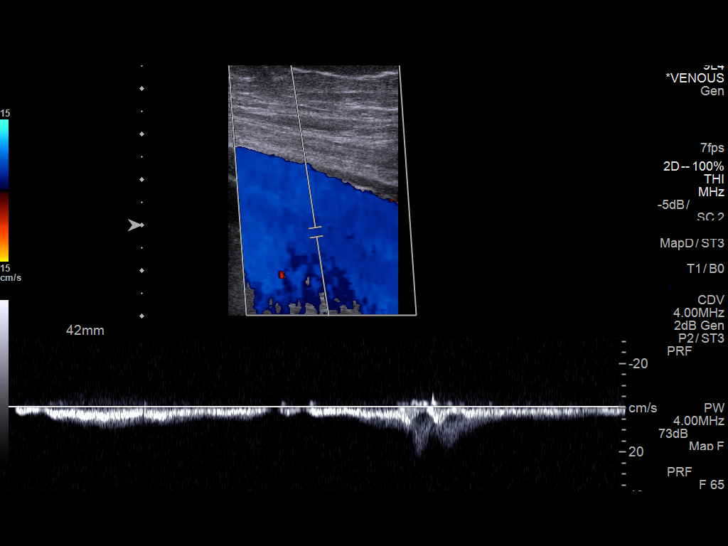
[im 13/29]
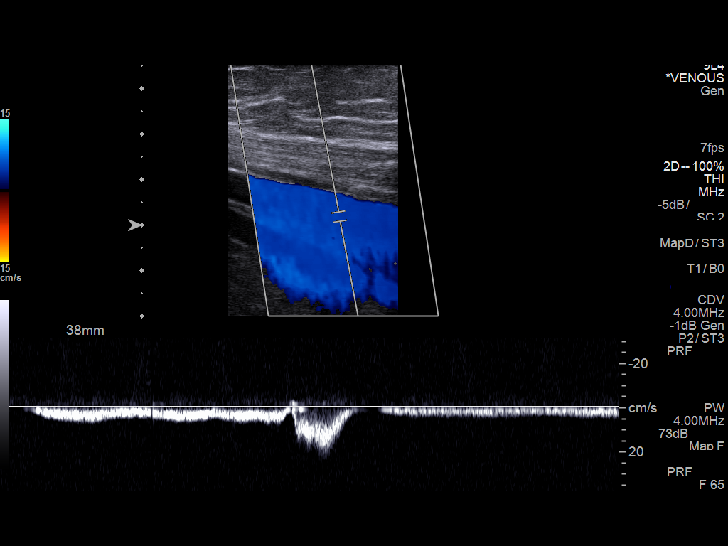
[im 15/29]
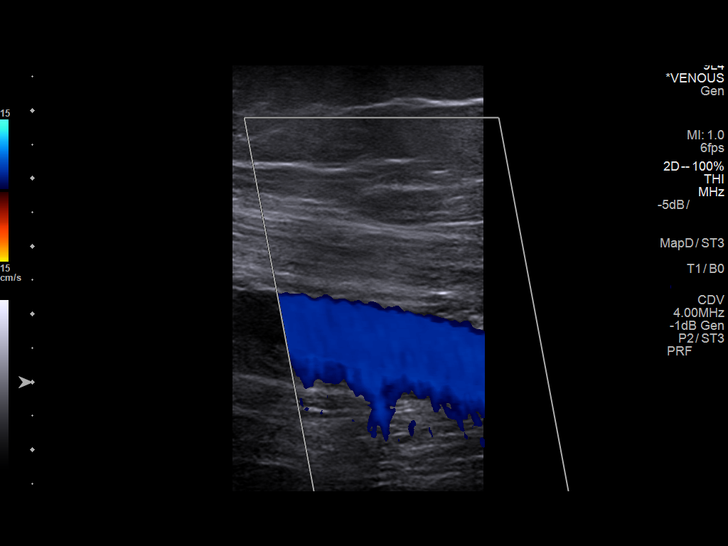
[im 16/29]
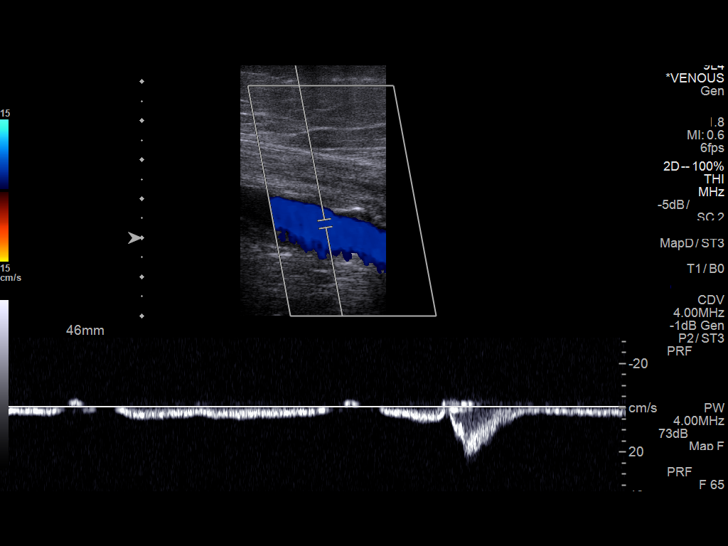
[im 19/29]
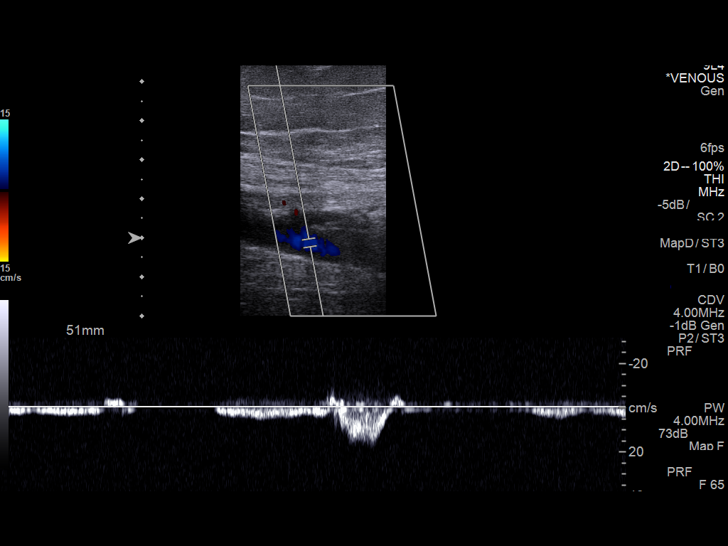
[im 21/29]
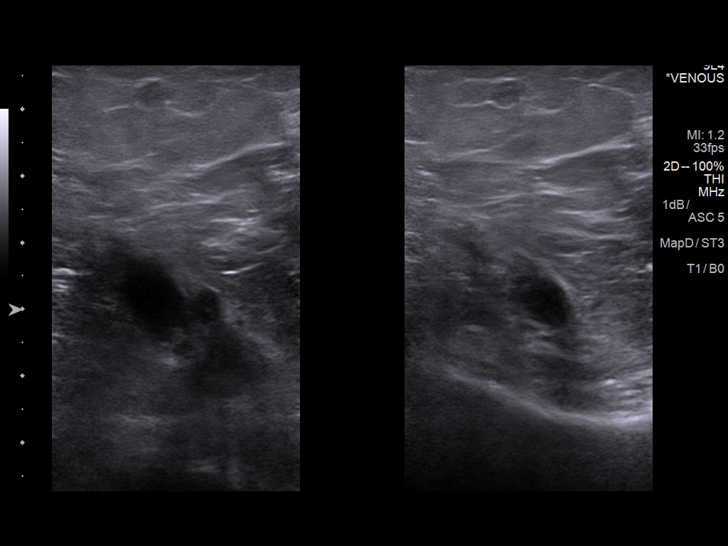
[im 24/29]
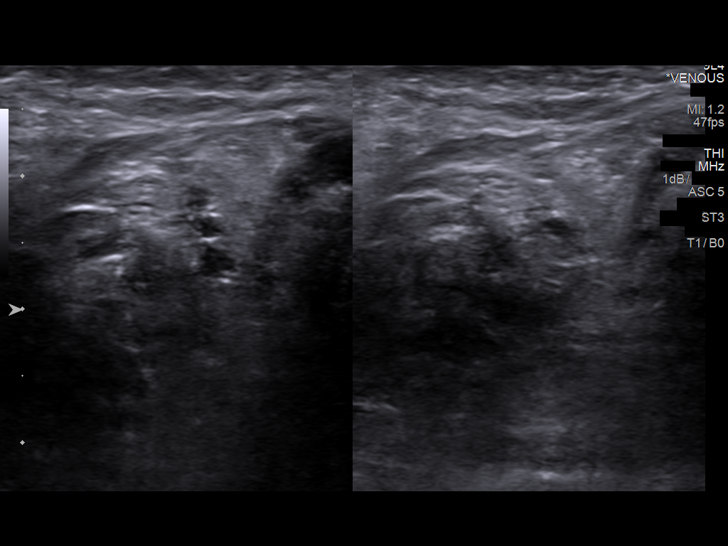
[im 26/29]
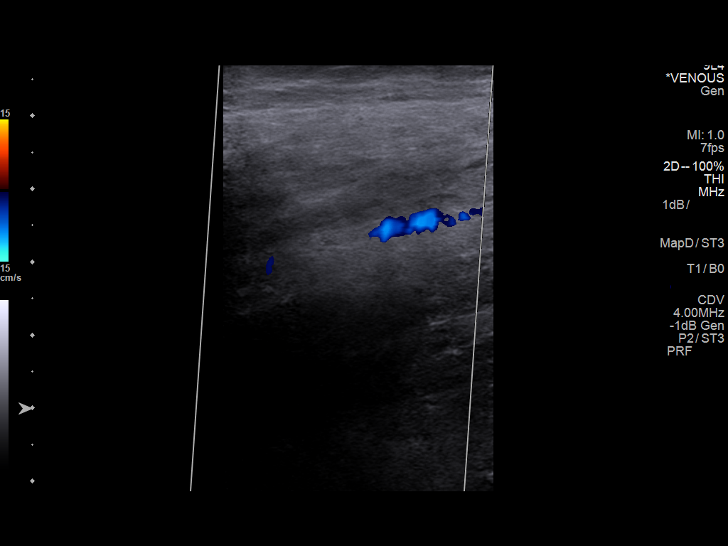
[im 29/29]
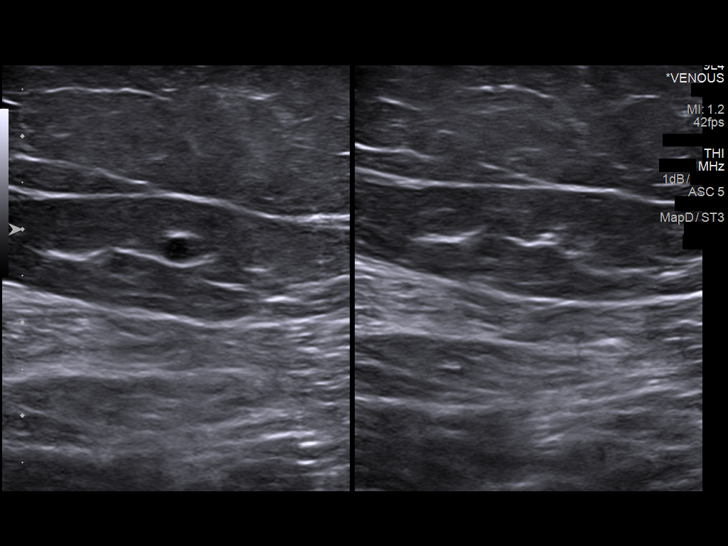

[13 of 24 positions shown; findings below may reference images not displayed]

FINDINGS: Contralateral Common Femoral Vein: Respiratory phasicity is normal
and symmetric with the symptomatic side. No evidence of thrombus.
Normal compressibility.

Common Femoral Vein: No evidence of thrombus. Normal
compressibility, respiratory phasicity and response to augmentation.

Saphenofemoral Junction: No evidence of thrombus. Normal
compressibility and flow on color Doppler imaging.

Profunda Femoral Vein: No evidence of thrombus. Normal
compressibility and flow on color Doppler imaging.

Femoral Vein: No evidence of thrombus. Normal compressibility,
respiratory phasicity and response to augmentation.

Popliteal Vein: No evidence of thrombus. Normal compressibility,
respiratory phasicity and response to augmentation.

Calf Veins: No evidence of thrombus. Normal compressibility and flow
on color Doppler imaging.

Superficial Great Saphenous Vein: No evidence of thrombus. Normal
compressibility.

Venous Reflux:  None.

Other Findings:  None.
IMPRESSION: No evidence of deep venous thrombosis.

## 2018-08-17 ENCOUNTER — Other Ambulatory Visit: Payer: Self-pay | Admitting: Family

## 2018-08-19 ENCOUNTER — Other Ambulatory Visit: Payer: Self-pay | Admitting: Endocrinology

## 2018-08-25 ENCOUNTER — Other Ambulatory Visit: Payer: Self-pay

## 2018-08-25 MED ORDER — ACCU-CHEK SMARTVIEW VI STRP
ORAL_STRIP | 1 refills | Status: AC
Start: 2018-08-25 — End: ?

## 2018-09-02 ENCOUNTER — Other Ambulatory Visit: Payer: Self-pay | Admitting: Family

## 2018-09-20 ENCOUNTER — Ambulatory Visit: Payer: Medicare HMO | Admitting: Family

## 2018-10-31 ENCOUNTER — Other Ambulatory Visit: Payer: Self-pay | Admitting: Endocrinology

## 2018-11-02 ENCOUNTER — Other Ambulatory Visit: Payer: Self-pay | Admitting: Family

## 2018-12-02 ENCOUNTER — Other Ambulatory Visit: Payer: Self-pay | Admitting: Endocrinology

## 2018-12-03 NOTE — Telephone Encounter (Signed)
1.  Please schedule f/u appt 2.  Then please refill x 1, pending that appt.  

## 2018-12-05 ENCOUNTER — Other Ambulatory Visit: Payer: Self-pay | Admitting: Family

## 2018-12-05 NOTE — Telephone Encounter (Signed)
Please schedule appointment then route back so we can fill RX.

## 2018-12-05 NOTE — Telephone Encounter (Signed)
Called pt and left detailed voicemail informing pt to call back and schedule f/u in order to have refill sent to pharmacy.

## 2018-12-15 NOTE — Telephone Encounter (Signed)
Letter sent to pt requesting that she call and schedule appt.

## 2018-12-15 NOTE — Telephone Encounter (Signed)
Called pt and left detailed voicemail with MD message to schedule appt.

## 2019-03-02 ENCOUNTER — Ambulatory Visit: Payer: Medicare HMO | Admitting: *Deleted

## 2019-04-25 ENCOUNTER — Telehealth: Payer: Self-pay | Admitting: Family

## 2019-04-25 NOTE — Telephone Encounter (Signed)
Please contact pt to schedule a follow up visit.  

## 2019-04-25 NOTE — Telephone Encounter (Signed)
Called patient she states she switched doctors and no longer see's Melissa
